# Patient Record
Sex: Male | Born: 1970 | Race: White | Hispanic: No | Marital: Single | Smoking: Never smoker
Health system: Southern US, Community
[De-identification: ages and names within clinical notes are randomized; demographics above are authoritative.]

## PROBLEM LIST (undated history)

## (undated) DIAGNOSIS — G4730 Sleep apnea, unspecified: Secondary | ICD-10-CM

## (undated) DIAGNOSIS — S83519A Sprain of anterior cruciate ligament of unspecified knee, initial encounter: Secondary | ICD-10-CM

## (undated) DIAGNOSIS — T7840XA Allergy, unspecified, initial encounter: Secondary | ICD-10-CM

## (undated) DIAGNOSIS — J45909 Unspecified asthma, uncomplicated: Secondary | ICD-10-CM

## (undated) DIAGNOSIS — J301 Allergic rhinitis due to pollen: Secondary | ICD-10-CM

## (undated) DIAGNOSIS — S82209A Unspecified fracture of shaft of unspecified tibia, initial encounter for closed fracture: Secondary | ICD-10-CM

## (undated) DIAGNOSIS — G2581 Restless legs syndrome: Secondary | ICD-10-CM

## (undated) HISTORY — DX: Allergic rhinitis due to pollen: J30.1

## (undated) HISTORY — PX: KNEE ARTHROSCOPY W/ ACL RECONSTRUCTION: SHX1858

## (undated) HISTORY — DX: Sleep apnea, unspecified: G47.30

## (undated) HISTORY — DX: Restless legs syndrome: G25.81

## (undated) HISTORY — DX: Unspecified fracture of shaft of unspecified tibia, initial encounter for closed fracture: S82.209A

## (undated) HISTORY — DX: Sprain of anterior cruciate ligament of unspecified knee, initial encounter: S83.519A

## (undated) HISTORY — PX: TIBIA FRACTURE SURGERY: SHX806

## (undated) HISTORY — DX: Unspecified asthma, uncomplicated: J45.909

## (undated) HISTORY — DX: Allergy, unspecified, initial encounter: T78.40XA

## (undated) HISTORY — PX: TONSILLECTOMY: SUR1361

---

## 1997-08-29 ENCOUNTER — Emergency Department (HOSPITAL_COMMUNITY): Admission: EM | Admit: 1997-08-29 | Discharge: 1997-08-29 | Payer: Self-pay | Admitting: Emergency Medicine

## 2007-05-20 ENCOUNTER — Ambulatory Visit: Payer: Self-pay | Admitting: Family Medicine

## 2007-08-08 ENCOUNTER — Ambulatory Visit: Payer: Self-pay | Admitting: Orthopaedic Surgery

## 2007-10-01 ENCOUNTER — Ambulatory Visit: Payer: Self-pay | Admitting: Orthopaedic Surgery

## 2008-02-06 ENCOUNTER — Ambulatory Visit: Payer: Self-pay | Admitting: Family Medicine

## 2010-05-20 ENCOUNTER — Ambulatory Visit: Payer: Self-pay | Admitting: Family Medicine

## 2010-05-20 DIAGNOSIS — L255 Unspecified contact dermatitis due to plants, except food: Secondary | ICD-10-CM | POA: Insufficient documentation

## 2010-06-30 NOTE — Assessment & Plan Note (Signed)
Summary: POISON OAK/CLE   Vital Signs:  Patient profile:   40 year old male Height:      68 inches Weight:      177.75 pounds BMI:     27.12 Temp:     97.9 degrees F oral Pulse rate:   80 / minute Pulse rhythm:   regular BP sitting:   110 / 70  (left arm) Cuff size:   regular  Vitals Entered By: Benny Lennert CMA Duncan Dull) (May 20, 2010 9:02 AM)  History of Present Illness: Chief complaint poison Lajoyce Corners  very pleasant patient presents to I know well who has some poison oak or IVy dermatitis is present on his face, or and including the posterior aspect of his neck. He also has some numbness on bilateral hands and on his feet bilaterally.  This is quite itchy, he had an episode before he had some severe poison ivy around his face and has some scarring above one of his eyes.  This point it is not interfering in her in the eye or ocular area.  Allergies (verified): No Known Drug Allergies  Past History:  Past medical, surgical, family and social histories (including risk factors) reviewed, and no changes noted (except as noted below).  Past Medical History: Reviewed history from 02/06/2008 and no changes required. L sided, ACL rupture, 04/2007 Tibial plateau fracture, 04/2007 Seasonal Allergies  Past Surgical History: Reviewed history from 02/06/2008 and no changes required. ACL reconstruction, 09/2007, Dr. Mack Guise, [facility] Arthroscopy, 07/2007, L knee, Dr. Mack Guise Tonsillectomy / Adenoidectomy Arthroscopy, R knee, 1990, scar tissue removal  Family History: Reviewed history from 02/06/2008 and no changes required. Family History of Colon CA 1st degree relative <60  Mother - 7, Breast CA Father 16  1 brother  Children - acid reflux     Social History: Reviewed history from 02/06/2008 and no changes required. 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 CV: No chest pain or SOB GI: No noted N or V Otherwise, pertinent positives and negatives are noted in the HPI.   Physical Exam  General:  GEN: Well-developed,well-nourished,in no acute distress; alert,appropriate and cooperative throughout examination HEENT: Normocephalic and atraumatic without obvious abnormalities. No apparent alopecia or balding. Ears, externally no deformities PULM: Breathing comfortably in no respiratory distress EXT: No clubbing, cyanosis, or edema PSYCH: Normally interactive. Cooperative during the interview. Pleasant. Friendly and conversant. Not anxious or depressed appearing. Normal, full affect.  Skin:  vesicular lines is evidence of excoriation and right-sided face, posterior neck the neck, bilateral hands, lower extremities as well.   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 IM Decadron 8 mg.  His updated medication list for this problem includes:    Triamcinolone Acetonide 0.1 % Crea (Triamcinolone acetonide) .Marland Kitchen... Apply two times a day to affected area  Orders: Dexamethasone Sodium Phosphate 1mg  (J1100) Admin of Therapeutic Inj  intramuscular or subcutaneous ([address])  Complete Medication List: 1)  Triamcinolone Acetonide 0.1 % Crea (Triamcinolone acetonide) .... Apply two times a day to affected area  Patient Instructions: 1)  STEROID SHOT WILL GET IN SYSTEM WITHIN THE DAY 2)  USE CREAM, BUT NOT ON FACE Prescriptions: TRIAMCINOLONE ACETONIDE 0.1 % CREA (TRIAMCINOLONE ACETONIDE) Apply two times a  day to affected area  #1 pound x 0   Entered and Authorized by:   Hannah Beat MD   Signed by:   Hannah Beat MD on 05/20/2010   Method used:   Electronically to        [facility] [id number]retail[facility]       [address]       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 Patient Level IV [99214] 2)  Dexamethasone Sodium Phosphate 1mg  [J1100] 3)  Admin of Therapeutic Inj  intramuscular or subcutaneous [96372]    Prior Medications (reviewed today): None Current Allergies (reviewed today): No known allergies

## 2010-09-23 ENCOUNTER — Encounter: Payer: Self-pay | Admitting: Family Medicine

## 2010-09-26 ENCOUNTER — Encounter: Payer: Self-pay | Admitting: Family Medicine

## 2010-09-26 ENCOUNTER — Ambulatory Visit (INDEPENDENT_AMBULATORY_CARE_PROVIDER_SITE_OTHER): Admitting: Family Medicine

## 2010-09-26 VITALS — BP 120/84 | HR 71 | Temp 98.5°F | Ht 68.0 in | Wt 176.1 lb

## 2010-09-26 DIAGNOSIS — J301 Allergic rhinitis due to pollen: Secondary | ICD-10-CM

## 2010-09-26 HISTORY — DX: Allergic rhinitis due to pollen: J30.1

## 2010-09-26 MED ORDER — FLUTICASONE PROPIONATE 50 MCG/ACT NA SUSP: 2.0000 | Freq: Every day | NASAL | Status: DC

## 2010-09-26 NOTE — Patient Instructions (Signed)
ALLERGIES -Sneezing, runny and stuffy nose, itchy eyes, nose, throat, tears.  TREATMENT 1. Avoid offending allergen 2. Air filtration devices can reduce concentration of allergen 3. Intranasal steroid sprays are the most effective control (FLONASE) 4. Sedating (Benadryl, Zyrtec) and Non-sedating (Allegra, Claritin,Loratidine,Alavert) antihistamines are available at pharmacy 5. Presciption anti-histamines are available if the above fail FOR NOW, TAKE SOME SUDAFED (DECONGESTANT)

## 2010-09-26 NOTE — Progress Notes (Signed)
40 year old:  Has some bad seasonal allergies, went to the a flight   On the second flight, felt heis ears go deaf some, had a lot of pain in his sinuses.  Now both ears are hurting a lot.  Now cannot hear.   Sinex mist.  No oral antihistamines.  Subjective:     Troy Sims is a 40 y.o. male who presents for evaluation and treatment of allergic symptoms. Symptoms include: clear rhinorrhea, nasal congestion, postnasal drip, pressure sensation in ears and sinus pressure and are present in a seasonal pattern. Precipitants include: pollen. Treatment currently includes nasal saline, intranasal decongestants:  and are not effective.  ROS: GEN: above GI: Tolerating PO intake GU: maintaining adequate hydration and urination Pulm: No SOB Interactive and getting along well at home.  Otherwise, ROS is as per the HPI.   Review of Systems   Objective:   Gen: WDWN, NAD; A & O x3, cooperative. Pleasant.Globally Non-toxic HEENT: Normocephalic and atraumatic. Throat clear, w/o exudate, R TM clear, L TM - good landmarks, No fluid present. rhinnorhea.  MMM Frontal sinuses: NT Max sinuses: NT NECK: Anterior cervical  LAD is absent CV: RRR, No M/G/R, cap refill <2 sec PULM: Breathing comfortably in no respiratory distress. no wheezing, crackles, rhonchi EXT: No c/c/e PSYCH: Friendly, good eye contact MSK: Nml gait    Assessment:    Allergic rhinitis.    Plan:   Flonase Refer to the patient instructions sections for details of plan shared with patient.

## 2011-06-01 ENCOUNTER — Ambulatory Visit (INDEPENDENT_AMBULATORY_CARE_PROVIDER_SITE_OTHER): Admitting: Family Medicine

## 2011-06-01 ENCOUNTER — Encounter: Payer: Self-pay | Admitting: Family Medicine

## 2011-06-01 VITALS — BP 120/70 | HR 67 | Temp 98.6°F | Wt 176.0 lb

## 2011-06-01 DIAGNOSIS — L02429 Furuncle of limb, unspecified: Secondary | ICD-10-CM

## 2011-06-01 MED ORDER — DOXYCYCLINE HYCLATE 100 MG PO TABS: 100.0000 mg | ORAL_TABLET | Freq: Two times a day (BID) | ORAL | Status: AC

## 2011-06-01 NOTE — Progress Notes (Signed)
  Patient Name: Troy Sims Date of Birth: [birth year] Age: 41 y.o. Medical Record Number: [id number] Gender: male Date of Encounter: 06/01/2011  History of Present Illness:  Troy Sims is a 41 y.o. very pleasant male patient who presents with the following:  Patient well known to me who presents with left-sided axillary redness and pain with discrete areas that are painful to touch. They have not been draining any pus. He has had this problem once before. 2 the areas have improved, and the other largest area was larger around the size of a golf ball, but has improved now. No no pus. He has been using some warm compresses.  Past Medical History, Surgical History, Social History, Family History, Problem List, Medications, and Allergies have been reviewed and updated if relevant.  Review of Systems: ROS: GEN: Acute illness details above GI: Tolerating PO intake GU: maintaining adequate hydration and urination Pulm: No SOB Interactive and getting along well at home.  Otherwise, ROS is as per the HPI.   Physical Examination: Filed Vitals:   06/01/11 1240  BP: 120/70  Pulse: 67  Temp: 98.6 F (37 C)  TempSrc: Oral  Weight: 176 lb (79.833 kg)    There is no height on file to calculate BMI.   GEN: WDWN, NAD, Non-toxic, Alert & Oriented x 3 HEENT: Atraumatic, Normocephalic.  Ears and Nose: No external deformity. EXTR: No clubbing/cyanosis/edema NEURO: Normal gait.  PSYCH: Normally interactive. Conversant. Not depressed or anxious appearing.  Calm demeanor.   SKIN: Left axilla with 1 large boil and 2-3 other lesions that are healing  Assessment and Plan: 1. Boil, axilla     Doxy 100 mg po bid x 10 d If he has recurrent problems, we may need to do Hibiclens protocol and bactroban nasal protocol

## 2011-09-04 ENCOUNTER — Ambulatory Visit (INDEPENDENT_AMBULATORY_CARE_PROVIDER_SITE_OTHER): Admitting: Family Medicine

## 2011-09-04 ENCOUNTER — Encounter: Payer: Self-pay | Admitting: Family Medicine

## 2011-09-04 ENCOUNTER — Ambulatory Visit (INDEPENDENT_AMBULATORY_CARE_PROVIDER_SITE_OTHER)
Admission: RE | Admit: 2011-09-04 | Discharge: 2011-09-04 | Disposition: A | Discharge status: Home / Self Care | Source: Ambulatory Visit | Attending: Family Medicine | Admitting: Family Medicine

## 2011-09-04 VITALS — BP 130/82 | HR 72 | Temp 98.3°F | Wt 175.0 lb

## 2011-09-04 DIAGNOSIS — S6010XA Contusion of unspecified finger with damage to nail, initial encounter: Secondary | ICD-10-CM

## 2011-09-04 DIAGNOSIS — S6990XA Unspecified injury of unspecified wrist, hand and finger(s), initial encounter: Secondary | ICD-10-CM

## 2011-09-04 DIAGNOSIS — S6000XA Contusion of unspecified finger without damage to nail, initial encounter: Secondary | ICD-10-CM

## 2011-09-04 DIAGNOSIS — S6980XA Other specified injuries of unspecified wrist, hand and finger(s), initial encounter: Secondary | ICD-10-CM

## 2011-09-04 NOTE — Progress Notes (Signed)
  Patient Name: Troy Sims Date of Birth: 17-Aug-1970 Age: 41 y.o. Medical Record Number: [id number] Gender: male Date of Encounter: 09/04/2011  History of Present Illness:  Troy Sims is a 41 y.o. very pleasant male patient who presents with the following:  Pleasant patient who on Friday, 09/01/2011, had a very heavy object fall on his right hand and finger. Since then he has experienced some significant pain, and also had some very severe bruising and pain underneath his fingernail and at the base. He is able to move it, flexion and extension are intact.  Past Medical History, Surgical History, Social History, Family History, Problem List, Medications, and Allergies have been reviewed and updated if relevant.  Review of Systems:  GEN: No fevers, chills. Nontoxic. Primarily MSK c/o today. MSK: Detailed in the HPI GI: tolerating PO intake without difficulty Neuro: No numbness, parasthesias, or tingling associated. Otherwise the pertinent positives of the ROS are noted above.    Physical Examination: Filed Vitals:   09/04/11 1428  BP: 130/82  Pulse: 72  Temp: 98.3 F (36.8 C)  TempSrc: Oral  Weight: 175 lb (79.379 kg)    There is no height on file to calculate BMI.   GEN: WDWN, NAD, Non-toxic, Alert & Oriented x 3 HEENT: Atraumatic, Normocephalic.  Ears and Nose: No external deformity. EXTR: No clubbing/cyanosis/edema NEURO: Normal gait.  PSYCH: Normally interactive. Conversant. Not depressed or anxious appearing.  Calm demeanor.   Left middle finger distally it is quite tender to palpate throughout the distal phalanx as well as tender to palpation on the dorsal aspect with extensive subungual hematoma. All hands and fingers move appropriately with normal range of motion. Nontender throughout the rest of the bony anatomy and the left hand  Assessment and Plan:  1. Injury of middle finger  DG Finger Middle Left, DG Finger Middle Left  2. Hematoma, subungual, finger        Dg Finger Middle Left  09/04/2011  *RADIOLOGY REPORT*  Clinical Data: Injury, left middle finger.  LEFT MIDDLE FINGER 2+V  Comparison: None.  Findings: No acute bony abnormality.  Specifically, no fracture, subluxation, or dislocation.  Soft tissues are intact.  Joint spaces and bone mineralization are normal.  IMPRESSION: Normal study.  Original Report Authenticated By: Cyndie Chime, M.D.  -- on my examination at the distalmost in, on the lateral view and oblique, there appears to be a very small radiopaque changes would be consistent adjacent to the bone with a small avulsion. I discussed this with the patient, and is likely not clinically significant and would alter our plan of care anyway.  Hematoma evacuation, left third digit. Verbal he consented. Prepped with alcohol. A paper clip was heated with a lighter until it became red hot. This was placed in the area of subungal hematoma. 2 passes needed to be made, and subsequently less then 1 cc of blood was expressed. Immediate pain relief after the procedure.   Orders Today: Orders Placed This Encounter  Procedures  . DG Finger Middle Left    Standing Status: Future     Number of Occurrences: 1     Standing Expiration Date: 11/03/2012    Order Specific Question:  Reason for exam:    Answer:  injury    Order Specific Question:  Preferred imaging location?    Answer:  [place]    Medications Today: No orders of the defined types were placed in this encounter.

## 2011-11-10 ENCOUNTER — Ambulatory Visit (INDEPENDENT_AMBULATORY_CARE_PROVIDER_SITE_OTHER): Admitting: Family Medicine

## 2011-11-10 ENCOUNTER — Encounter: Payer: Self-pay | Admitting: Family Medicine

## 2011-11-10 VITALS — BP 102/64 | HR 75 | Temp 98.4°F | Wt 179.0 lb

## 2011-11-10 DIAGNOSIS — L0390 Cellulitis, unspecified: Secondary | ICD-10-CM

## 2011-11-10 DIAGNOSIS — L0291 Cutaneous abscess, unspecified: Secondary | ICD-10-CM

## 2011-11-10 MED ORDER — DOXYCYCLINE HYCLATE 100 MG PO TABS: 100.0000 mg | ORAL_TABLET | Freq: Two times a day (BID) | ORAL | Status: AC

## 2011-11-10 NOTE — Progress Notes (Signed)
L axillary lesions.  Has happened prev and resolved.  It returned and he was able to drain it with compression.  Then had another lesion erupt nearby.  Area is sore.  No FCNAVD.    Meds, vitals, and allergies reviewed.   ROS: See HPI.  Otherwise, noncontributory.  nad ncat Neck supple, no LA No supraclavicular LA L axilla with two small tender lesions that appear to have prev drained.  Don't appear to communicate with each other.  No fluctuant mass.

## 2011-11-10 NOTE — Patient Instructions (Signed)
I would use warm compresses a few times a day.  Start the antibiotics and use dial soap or equivalent.  This should get better.

## 2011-11-12 DIAGNOSIS — L0291 Cutaneous abscess, unspecified: Secondary | ICD-10-CM | POA: Insufficient documentation

## 2011-11-12 NOTE — Assessment & Plan Note (Signed)
Has prev drained, no need to I&D today.  Use warm compressed, start doxy, f/u prn.  He agrees.

## 2012-05-02 ENCOUNTER — Telehealth: Payer: Self-pay

## 2012-05-02 NOTE — Telephone Encounter (Signed)
Pt left v/m; pt in [employer] and needs weigh in and flu shot. Pt needs ASAP and will come in for appt with Dr Patsy Lager if needed. Pt can come in anytime.Please advise.

## 2012-05-02 NOTE — Telephone Encounter (Signed)
Can i just do nurse visit?

## 2012-05-02 NOTE — Telephone Encounter (Signed)
Nurse visit ok, i don't need to see him

## 2012-05-03 ENCOUNTER — Ambulatory Visit

## 2012-05-08 ENCOUNTER — Encounter: Payer: Self-pay | Admitting: *Deleted

## 2012-05-08 ENCOUNTER — Ambulatory Visit (INDEPENDENT_AMBULATORY_CARE_PROVIDER_SITE_OTHER): Admitting: *Deleted

## 2012-05-08 DIAGNOSIS — Z23 Encounter for immunization: Secondary | ICD-10-CM

## 2012-11-13 ENCOUNTER — Ambulatory Visit (INDEPENDENT_AMBULATORY_CARE_PROVIDER_SITE_OTHER): Admitting: Family Medicine

## 2012-11-13 ENCOUNTER — Encounter: Payer: Self-pay | Admitting: Family Medicine

## 2012-11-13 VITALS — BP 122/82 | HR 76 | Temp 98.2°F | Wt 183.0 lb

## 2012-11-13 DIAGNOSIS — L02429 Furuncle of limb, unspecified: Secondary | ICD-10-CM

## 2012-11-13 DIAGNOSIS — M25561 Pain in right knee: Secondary | ICD-10-CM

## 2012-11-13 DIAGNOSIS — M25569 Pain in unspecified knee: Secondary | ICD-10-CM

## 2012-11-13 NOTE — Patient Instructions (Addendum)
[facility]

## 2012-11-13 NOTE — Progress Notes (Signed)
Nature conservation officer at [facility] [address] [place] [address] Phone: [phone] Fax: [phone]  Date:  11/13/2012   Name:  Troy Sims   DOB:  17-Oct-1970   MRN:  [id number] Gender: male Age: 42 y.o.  Primary Physician:  Hannah Beat, MD  Evaluating MD: Hannah Beat, MD   Chief Complaint: swollen painful red spot on right knee   History of Present Illness:  Troy Sims is a 42 y.o. pleasant patient who presents with the following:  Pain in right knee,  H/o L ACL with Dr. Mack Guise.  He has started to have a boil on the lateral aspect of the R patella on Sunday, then it has gotten progressively worse. On Tuesday, he tried to pop it and got some pus out. This morning he is having quite a bit more pain, pain deep in the knee joint, expanding redness, some swelling and pain with motion.  No systemic fever.   H/o posterior leg / gluteal abscess 1 year ago. Multiple family members with a history of MRSA.  Patient Active Problem List   Diagnosis Date Noted  . Abscess 11/12/2011  . Allergic rhinitis due to pollen 09/26/2010  . POISON IVY DERMATITIS 05/20/2010    Past Medical History  Diagnosis Date  . Allergy   . Tibial fracture   . ACL (anterior cruciate ligament) rupture   . Allergic rhinitis due to pollen 09/26/2010    No past surgical history on file.  History   Social History  . Marital Status: Single    Spouse Name: N/A    Number of Children: N/A  . Years of Education: N/A   Occupational History  . truck driver    Social History Main Topics  . Smoking status: Never Smoker   . Smokeless tobacco: Never Used  . Alcohol Use: No  . Drug Use: No  . Sexually Active: Not on file   Other Topics Concern  . 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 Cancer Mother     breast     No Known Allergies  Medication list has been reviewed and updated.  No outpatient prescriptions prior to visit.   No facility-administered medications prior to visit.    Review of Systems:   GEN: No acute illnesses, no fevers, chills. GI: No n/v/d, eating normally Pulm: No SOB Interactive and getting along well at home.  Otherwise, ROS is as per the HPI.   Physical Examination: BP 122/82  Pulse 76  Temp(Src) 98.2 F (36.8 C) (Oral)  Wt 183 lb (83.008 kg)  BMI 27.83 kg/m2  Ideal Body Weight:     GEN: WDWN, NAD, Non-toxic, Alert & Oriented x 3 HEENT: Atraumatic, Normocephalic.  Ears and Nose: No external deformity. EXTR: No clubbing/cyanosis/edema NEURO: Normal gait.  PSYCH: Normally interactive. Conversant. Not depressed or anxious appearing.  Calm demeanor.   Knee:  R Gait: Normal heel toe pattern ROM: 0-130 Effusion: mild Echymosis or edema: REDNESS, TENDERNESS AND WARMTH FOR 4 CM AROUND CENTRAL BOIL ON LATERAL ASPECT OF PATELLA Patellar tendon NT Painful PLICA: neg Patellar grind: negative Medial and lateral patellar facet loading: negative medial and lateral joint lines:NT Mcmurray's neg Flexion-pinch neg Varus and valgus stress: stable  Lachman: neg Ant and Post drawer: neg Hip abduction, IR, ER: WNL Hip flexion str: 5/5 Hip abd: 5/5 Quad: 5/5 VMO atrophy:No Hamstring concentric and eccentric: 5/5   Assessment and Plan: Boil, knee  Knee pain, acute, right   With his history and worsening condition, certainly has a skin infection, and I have some concern that this could be an early septic joint. I called Dr. Yisroel Ramming who kindly agreed to see the patient this afternoon.  Signed, Elpidio Galea. Marcia Hartwell, MD 11/13/2012 4:28 PM

## 2013-01-06 ENCOUNTER — Telehealth: Payer: Self-pay

## 2013-01-06 DIAGNOSIS — F4322 Adjustment disorder with anxiety: Secondary | ICD-10-CM

## 2013-01-06 NOTE — Telephone Encounter (Signed)
Pt's wife request referral to [facility], counselors in [facility] fax # [phone] and phone # [phone]. Pt request marriage counseling and does require referral from PCP; pt was first seen with [facility] on 01/03/13 and scheduled next visit at 01/10/13. Pt's wife request cb.

## 2013-01-06 NOTE — Telephone Encounter (Signed)
Left message asking pt's wife to call back.

## 2013-01-07 NOTE — Telephone Encounter (Signed)
Spoke with patient's wife, pt needs referral before his appt with counselor on Friday.

## 2013-01-08 NOTE — Telephone Encounter (Signed)
Dr Ermalene Searing, I spoke to the counseling ctr secretary and please use code 309.24 Anxious Mood for his DX code for this referral and I will send it over to their office.

## 2013-03-27 ENCOUNTER — Encounter: Payer: Self-pay | Admitting: Family Medicine

## 2013-03-27 ENCOUNTER — Ambulatory Visit (INDEPENDENT_AMBULATORY_CARE_PROVIDER_SITE_OTHER): Admitting: Family Medicine

## 2013-03-27 VITALS — BP 104/78 | HR 70 | Temp 98.1°F | Ht 68.0 in | Wt 184.2 lb

## 2013-03-27 DIAGNOSIS — L0390 Cellulitis, unspecified: Secondary | ICD-10-CM

## 2013-03-27 DIAGNOSIS — L0291 Cutaneous abscess, unspecified: Secondary | ICD-10-CM

## 2013-03-27 DIAGNOSIS — L02416 Cutaneous abscess of left lower limb: Secondary | ICD-10-CM

## 2013-03-27 DIAGNOSIS — L02419 Cutaneous abscess of limb, unspecified: Secondary | ICD-10-CM

## 2013-03-27 MED ORDER — CEFTRIAXONE SODIUM 1 G IJ SOLR
1.0000 g | Freq: Once | INTRAMUSCULAR | Status: AC
  Administered 2013-03-27: 1 g via INTRAMUSCULAR

## 2013-03-27 MED ORDER — CEPHALEXIN 500 MG PO CAPS: 1000.0000 mg | ORAL_CAPSULE | Freq: Two times a day (BID) | ORAL | Status: DC

## 2013-03-27 MED ORDER — SULFAMETHOXAZOLE-TMP DS 800-160 MG PO TABS: 2.0000 | ORAL_TABLET | Freq: Two times a day (BID) | ORAL | Status: DC

## 2013-03-28 NOTE — Progress Notes (Signed)
   Date:  03/27/2013   Name:  Troy Sims   DOB:  Apr 10, 1971   MRN:  [id number] Gender: male Age: 42 y.o.  Primary Physician:  Hannah Beat, MD   Chief Complaint: Cellulitis   History of Present Illness:  Troy Sims is a 42 y.o. very pleasant male patient who presents with the following:  Pleasant gentleman however no well who is presenting today with an approximate two-day history of worsening redness and warmth on the lateral aspect of his LEFT hip. He is not had any sort of trauma or injury at all. He does have a significant history for multiple boils and abscesses in the past. He has had one in the axilla and most recently had one on the lateral aspect of his knee. All of these have done relatively well and been treated conservatively with antibiotics with resolution. He does have a daughter with a positive history of MRSA.  Past Medical History, Surgical History, Social History, Family History, Problem List, Medications, and Allergies have been reviewed and updated if relevant.  Current Outpatient Prescriptions on File Prior to Visit  Medication Sig Dispense Refill  . Ibuprofen (ADVIL) 200 MG CAPS Take by mouth as needed.       No current facility-administered medications on file prior to visit.    Review of Systems: ROS: GEN: Acute illness details above GI: Tolerating PO intake GU: maintaining adequate hydration and urination Pulm: No SOB Interactive and getting along well at home.  Otherwise, ROS is as per the HPI.   Physical Examination: BP 104/78  Pulse 70  Temp(Src) 98.1 F (36.7 C) (Oral)  Ht 5\' 8"  (1.727 m)  Wt 184 lb 4 oz (83.575 kg)  BMI 28.02 kg/m2   GEN: WDWN, NAD, Non-toxic, Alert & Oriented x 3 HEENT: Atraumatic, Normocephalic.  Ears and Nose: No external deformity. EXTR: No clubbing/cyanosis/edema NEURO: Normal gait.  PSYCH: Normally interactive. Conversant. Not depressed or anxious appearing.  Calm demeanor.   Skin: on the lateral  aspect of the left hip, the patient has redness, warmth with some induration but no fluctuance.  Assessment and Plan:  Abscess of hip, left  Cellulitis - Plan: cefTRIAXone (ROCEPHIN) injection 1 g  ABX, IM now Then keflex and septra Warm compresses Strict precautions Borders drawn with sharpie  There are no Patient Instructions on file for this visit.  Orders Today:  No orders of the defined types were placed in this encounter.    Updated Medication List: (Includes new medications, updates to list, dose adjustments) Meds ordered this encounter  Medications  . cephALEXin (KEFLEX) 500 MG capsule    Sig: Take 2 capsules (1,000 mg total) by mouth 2 (two) times daily.    Dispense:  40 capsule    Refill:  0  . sulfamethoxazole-trimethoprim (BACTRIM DS) 800-160 MG per tablet    Sig: Take 2 tablets by mouth 2 (two) times daily.    Dispense:  40 tablet    Refill:  0  . cefTRIAXone (ROCEPHIN) injection 1 g    Sig:     Medications Discontinued: There are no discontinued medications.    Signed,  Elpidio Galea. Arshdeep Bolger, MD, CAQ Sports Medicine  [employer] at [facility] [address] [place] [address] Phone: [phone] Fax: [phone]

## 2013-04-03 ENCOUNTER — Other Ambulatory Visit: Payer: Self-pay

## 2013-08-04 ENCOUNTER — Ambulatory Visit (INDEPENDENT_AMBULATORY_CARE_PROVIDER_SITE_OTHER): Admitting: Family Medicine

## 2013-08-04 ENCOUNTER — Encounter: Payer: Self-pay | Admitting: Family Medicine

## 2013-08-04 VITALS — BP 106/68 | HR 68 | Temp 98.1°F | Ht 68.0 in | Wt 191.8 lb

## 2013-08-04 DIAGNOSIS — M6790 Unspecified disorder of synovium and tendon, unspecified site: Secondary | ICD-10-CM

## 2013-08-04 DIAGNOSIS — M259 Joint disorder, unspecified: Secondary | ICD-10-CM

## 2013-08-04 DIAGNOSIS — M719 Bursopathy, unspecified: Secondary | ICD-10-CM

## 2013-08-04 DIAGNOSIS — M19019 Primary osteoarthritis, unspecified shoulder: Secondary | ICD-10-CM

## 2013-08-04 DIAGNOSIS — M62838 Other muscle spasm: Secondary | ICD-10-CM

## 2013-08-04 DIAGNOSIS — M67919 Unspecified disorder of synovium and tendon, unspecified shoulder: Secondary | ICD-10-CM

## 2013-08-04 NOTE — Progress Notes (Signed)
Pre visit review using our clinic review tool, if applicable. No additional management support is needed unless otherwise documented below in the visit note. 

## 2013-08-04 NOTE — Patient Instructions (Signed)
Advice about weightlifting with SHOULDER PROBLEMS:  1. Avoid overhead presses, military presses, clean and jerk, snatch, heavy incline bench presses, heavy bench presses, any kind of chest fly. Heavy lateral raises will hurt.  ALL OF THESE MAKE THE ROTATOR CUFF IMPINGE ON THE BONY ANATOMY ABOVE THEM AND OFTEN CAUSE RECURRENT BURSITIS AND ROTATOR CUFF TENDINITIS.  2. If you have to do bench presses then I would do the decline press and not lock out at the end of the lift. DUMBBELLS WILL TAKE STRESS OFF THE SHOULDER, SO I RECOMMEND SWITCHING FROM BAR PRESSES TO DUMBBELLS.  Continue with rotator cuff strengthening and scapular stabilizaton. Recommended that you could do all aerobic actvity.  3. All pulls, lat pull downs, pull-ups, mid-back low back, arms should be no problem. 4. Core is fine  

## 2013-08-04 NOTE — Progress Notes (Signed)
Date:  08/04/2013   Name:  Troy Sims   DOB:  12/21/1970   MRN:  [id number]  PCP:  Hannah Beat, MD   This 43 y.o. male patient noted above presents with shoulder pain that has been ongoing for 1 mo.  there is no history of trauma or accident recently The patient denies neck pain or radicular symptoms. Denies dislocation, subluxation, separation of the shoulder. The patient does complain occ of pain in the overhead plane with no painful arc of motion. Mostly around upper trap spasm, some with IROM  Medications Tried: Tylenol, NSAIDS Ice or Heat: minimally helpful Tried PT: No  Prior shoulder Injury: No Prior surgery: No Prior fracture: No  Separated now from wife.   R shoulder on the back of his back shoulder blacde. But he can do anything. Occ will lose strength in his right arm.   Throwing softball yesterday, and it did not hurt. A little hurt in doing   1 mo.   The PMH, PSH, Social History, Family History, Medications, and allergies have been reviewed in [facility], and have been updated if relevant.  REVIEW OF SYSTEMS  GEN: No fevers, chills. Nontoxic. Primarily MSK c/o today. MSK: Detailed in the HPI GI: tolerating PO intake without difficulty Neuro: No numbness, parasthesias, or tingling associated. Otherwise the pertinent positives of the ROS are noted above.   PHYSICAL EXAM  Blood pressure 106/68, pulse 68, temperature 98.1 F (36.7 C), temperature source Oral, height 5\' 8"  (1.727 m), weight 191 lb 12 oz (86.977 kg).  GEN: Well-developed,well-nourished,in no acute distress; alert,appropriate and cooperative throughout examination HEENT: Normocephalic and atraumatic without obvious abnormalities. Ears, externally no deformities PULM: Breathing comfortably in no respiratory distress EXT: No clubbing, cyanosis, or edema PSYCH: Normally interactive. Cooperative during the interview. Pleasant. Friendly and conversant. Not anxious or depressed appearing. Normal,  full affect.  Shoulder: R Inspection: No muscle wasting or winging Ecchymosis/edema: neg  AC joint, scapula, clavicle: NT Cervical spine: NT, full ROM Spurling's: neg Abduction: full, 5/5 Flexion: full, 5/5 IR, full, lift-off: 5/5, some pain ER at neutral: full, 5/5 AC crossover: pos Neer: neg Hawkins: mild pos obriens neg Sulcus neg Drop Test: neg Empty Can: pos Supraspinatus insertion: mild-mod T Bicipital groove: NT Speed's: neg Yergason's: neg Sulcus sign: neg Scapular dyskinesis: none - some pain R upper trap C5-T1 intact  Neuro: Sensation intact Grip 5/5   Assessment and Plan:  Tendinopathy of rotator cuff  AC joint arthropathy  Trapezius muscle spasm   I think he will do fine with altering his lifts and working on RTC and scapular stabilization. If probs, I can set up with pt.  Rotator cuff strengthening and scapular stabilization exercises were reviewed with the patient.  [facility] RTC and scapular stabilization program or MOON shoulder protocol given to the patient. Retraining shoulder mechanics and function was emphasized to the patient with rehab done at least 5-6 days a week.  The patient could benefit from formal PT to assist with scapular stabilization and RTC strengthening.  Signed,  Elpidio Galea. Alleigh Mollica, MD, CAQ Sports Medicine  [facility] at [facility] [address] [place] [address] Phone: [phone] Fax: [phone]  Patient Instructions  Advice about weightlifting with SHOULDER PROBLEMS:  1. Avoid overhead presses, military presses, clean and jerk, snatch, heavy incline bench presses, heavy bench presses, any kind of chest fly. Heavy lateral raises will hurt.  ALL OF THESE MAKE THE ROTATOR CUFF IMPINGE ON THE BONY ANATOMY ABOVE THEM AND  OFTEN CAUSE RECURRENT BURSITIS AND ROTATOR CUFF TENDINITIS.  2. If you have to do bench presses then I would do the decline press and not lock out at the end of the lift. DUMBBELLS WILL  TAKE STRESS OFF THE SHOULDER, SO I RECOMMEND SWITCHING FROM BAR PRESSES TO DUMBBELLS.  Continue with rotator cuff strengthening and scapular stabilizaton. Recommended that you could do all aerobic actvity.  3. All pulls, lat pull downs, pull-ups, mid-back low back, arms should be no problem. 4. Core is fine      Patient's Medications  New Prescriptions   No medications on file  Previous Medications   No medications on file  Modified Medications   No medications on file  Discontinued Medications   CEPHALEXIN (KEFLEX) 500 MG CAPSULE    Take 2 capsules (1,000 mg total) by mouth 2 (two) times daily.   IBUPROFEN (ADVIL) 200 MG CAPS    Take by mouth as needed.   SULFAMETHOXAZOLE-TRIMETHOPRIM (BACTRIM DS) 800-160 MG PER TABLET    Take 2 tablets by mouth 2 (two) times daily.

## 2013-10-02 ENCOUNTER — Telehealth: Payer: Self-pay | Admitting: Family Medicine

## 2013-10-02 NOTE — Telephone Encounter (Signed)
Pt called requesting referral to PT. Patient has been doing shoulder exercises for almost a month to try and help the pain but not working well.   Call back [phone].

## 2013-10-02 NOTE — Telephone Encounter (Signed)
Can await PCP return

## 2013-10-08 ENCOUNTER — Other Ambulatory Visit: Payer: Self-pay | Admitting: Family Medicine

## 2013-10-08 DIAGNOSIS — M67919 Unspecified disorder of synovium and tendon, unspecified shoulder: Secondary | ICD-10-CM

## 2013-10-08 DIAGNOSIS — M19019 Primary osteoarthritis, unspecified shoulder: Secondary | ICD-10-CM

## 2013-10-08 NOTE — Telephone Encounter (Signed)
Left message for Mr. Troy Sims to return my call.

## 2013-10-08 NOTE — Telephone Encounter (Signed)
Mr. Troy Sims notified referral for PT has been ordered.  Await call from Troy Sims or Troy Sims with appointment.  Follow up with Dr. Patsy Sims 6 to 8 weeks after starting PT.

## 2013-10-08 NOTE — Telephone Encounter (Signed)
Please call  Sorry, i have been out of town.  I would do that as the next step, then f/u with me in about 6-8 weeks after starting PT. (15 min)

## 2013-11-12 ENCOUNTER — Encounter: Payer: Self-pay | Admitting: Family Medicine

## 2013-11-12 ENCOUNTER — Ambulatory Visit (INDEPENDENT_AMBULATORY_CARE_PROVIDER_SITE_OTHER): Admitting: Family Medicine

## 2013-11-12 VITALS — BP 122/70 | HR 74 | Temp 98.5°F | Wt 185.0 lb

## 2013-11-12 DIAGNOSIS — M259 Joint disorder, unspecified: Secondary | ICD-10-CM

## 2013-11-12 DIAGNOSIS — M19019 Primary osteoarthritis, unspecified shoulder: Secondary | ICD-10-CM

## 2013-11-12 DIAGNOSIS — M6790 Unspecified disorder of synovium and tendon, unspecified site: Secondary | ICD-10-CM

## 2013-11-12 DIAGNOSIS — M67919 Unspecified disorder of synovium and tendon, unspecified shoulder: Secondary | ICD-10-CM

## 2013-11-12 DIAGNOSIS — M719 Bursopathy, unspecified: Secondary | ICD-10-CM

## 2013-11-12 NOTE — Progress Notes (Signed)
Pre visit review using our clinic review tool, if applicable. No additional management support is needed unless otherwise documented below in the visit note. 

## 2013-11-12 NOTE — Patient Instructions (Signed)
Advice about weightlifting with SHOULDER PROBLEMS:  1. Avoid overhead presses, military presses, clean and jerk, snatch, heavy incline bench presses, heavy bench presses, any kind of chest fly. Heavy lateral raises will hurt.  ALL OF THESE MAKE THE ROTATOR CUFF IMPINGE ON THE BONY ANATOMY ABOVE THEM AND OFTEN CAUSE RECURRENT BURSITIS AND ROTATOR CUFF TENDINITIS.  2. If you have to do bench presses then I would do the decline press and not lock out at the end of the lift. DUMBBELLS WILL TAKE STRESS OFF THE SHOULDER, SO I RECOMMEND SWITCHING FROM BAR PRESSES TO DUMBBELLS.  Continue with rotator cuff strengthening and scapular stabilizaton. Recommended that you could do all aerobic actvity.  3. All pulls, lat pull downs, pull-ups, mid-back low back, arms should be no problem. 4. Core is fine  

## 2013-11-12 NOTE — Progress Notes (Signed)
[address] [place] [address] Phone: [phone] Fax: [phone]  Patient ID: Troy RicherGregory O Sims MRN: [id number], DOB: 1970/08/25, 43 y.o. Date of Encounter: 11/12/2013  Primary Physician:  Hannah BeatSpencer Copland, MD   Chief Complaint: Follow-up   Subjective:   History of Present Illness:  Troy Sims is a 43 y.o. very pleasant male patient who presents with the following:  Troy Sims is here in followup after 2 months of physical therapy, rotator cuff strengthening, scapular stabilization, and 3 months if shoulder pain. He is actually remarkably better. He still has occasional pain and has some occasional pain in his a.c. joint, but he otherwise is asymptomatic  Past Medical History, Surgical History, Social History, Family History, Problem List, Medications, and Allergies have been reviewed and updated if relevant.  Review of Systems:  GEN: No fevers, chills. Nontoxic. Primarily MSK c/o today. MSK: Detailed in the HPI GI: tolerating PO intake without difficulty Neuro: No numbness, parasthesias, or tingling associated. Otherwise the pertinent positives of the ROS are noted above.   Objective:   Physical Examination: BP 122/70  Pulse 74  Temp(Src) 98.5 F (36.9 C) (Tympanic)  Wt 185 lb (83.915 kg)  SpO2 98%   GEN: WDWN, NAD, Non-toxic, Alert & Oriented x 3 HEENT: Atraumatic, Normocephalic.  Ears and Nose: No external deformity. EXTR: No clubbing/cyanosis/edema NEURO: Normal gait.  PSYCH: Normally interactive. Conversant. Not depressed or anxious appearing.  Calm demeanor.   Shoulder: R Inspection: No muscle wasting or winging Ecchymosis/edema: neg  AC joint, scapula, clavicle: NT Cervical spine: NT, full ROM Spurling's: neg Abduction: full, 5/5 Flexion: full, 5/5 IR, full, lift-off: 5/5 ER at neutral: full, 5/5 AC crossover and compression: neg Neer: neg Hawkins: neg Drop Test: neg Empty Can: neg Supraspinatus insertion: NT Bicipital groove: NT Speed's:  neg Yergason's: neg Sulcus sign: neg Scapular dyskinesis: none C5-T1 intact Sensation intact Grip 5/5   Radiology: No results found.  Assessment & Plan:   Tendinopathy of rotator cuff  AC joint arthropathy  Twice weekly, continue with rotator cuff strengthening and scapular stabilization. Followup when necessary to  Patient Instructions  Advice about weightlifting with SHOULDER PROBLEMS:  1. Avoid overhead presses, military presses, clean and jerk, snatch, heavy incline bench presses, heavy bench presses, any kind of chest fly. Heavy lateral raises will hurt.  ALL OF THESE MAKE THE ROTATOR CUFF IMPINGE ON THE BONY ANATOMY ABOVE THEM AND OFTEN CAUSE RECURRENT BURSITIS AND ROTATOR CUFF TENDINITIS.  2. If you have to do bench presses then I would do the decline press and not lock out at the end of the lift. DUMBBELLS WILL TAKE STRESS OFF THE SHOULDER, SO I RECOMMEND SWITCHING FROM BAR PRESSES TO DUMBBELLS.  Continue with rotator cuff strengthening and scapular stabilizaton. Recommended that you could do all aerobic actvity.  3. All pulls, lat pull downs, pull-ups, mid-back low back, arms should be no problem. 4. Core is fine      New Prescriptions   No medications on file   Modified Medications   No medications on file   No orders of the defined types were placed in this encounter.   Follow-up: No Follow-up on file. Unless noted above, the patient is to follow-up if symptoms worsen. Red flags were reviewed with the patient.  Signed,  Elpidio GaleaSpencer T. Copland, MD, CAQ Sports Medicine   Discontinued Medications   No medications on file   Current Medications at Discharge:   Medication List    Notice As of 11/12/2013  2:19  PM   You have not been prescribed any medications.

## 2014-02-18 ENCOUNTER — Ambulatory Visit (INDEPENDENT_AMBULATORY_CARE_PROVIDER_SITE_OTHER): Admitting: Internal Medicine

## 2014-02-18 ENCOUNTER — Encounter: Payer: Self-pay | Admitting: Internal Medicine

## 2014-02-18 VITALS — BP 110/78 | HR 63 | Temp 98.3°F | Wt 186.8 lb

## 2014-02-18 DIAGNOSIS — L03221 Cellulitis of neck: Secondary | ICD-10-CM

## 2014-02-18 DIAGNOSIS — L0211 Cutaneous abscess of neck: Secondary | ICD-10-CM

## 2014-02-18 MED ORDER — SULFAMETHOXAZOLE-TMP DS 800-160 MG PO TABS: 1.0000 | ORAL_TABLET | Freq: Two times a day (BID) | ORAL | Status: DC

## 2014-02-18 NOTE — Progress Notes (Signed)
Pre visit review using our clinic review tool, if applicable. No additional management support is needed unless otherwise documented below in the visit note. 

## 2014-02-18 NOTE — Progress Notes (Signed)
   Subjective:    Patient ID: Troy Sims, male    DOB: 07-01-1970, 43 y.o.   MRN: [id number]  HPI  Pt presents to the clinic today with c/o a lump on the back of his neck. He noticed this 3 days ago. It started out like a pimple. It has grown in size and become more painful. He has tried to squeeze it but nothing has come out. He denies fever, chills or body aches.  Review of Systems      Past Medical History  Diagnosis Date  . Allergy   . Tibial fracture   . ACL (anterior cruciate ligament) rupture   . Allergic rhinitis due to pollen 09/26/2010    Current Outpatient Prescriptions  Medication Sig Dispense Refill  . sulfamethoxazole-trimethoprim (BACTRIM DS) 800-160 MG per tablet Take 1 tablet by mouth 2 (two) times daily.  14 tablet  0   No current facility-administered medications for this visit.    No Known Allergies  Family History  Problem Relation Age of Onset  . Cancer Mother     breast    History   Social History  . Marital Status: Single    Spouse Name: N/A    Number of Children: N/A  . Years of Education: N/A   Occupational History  . truck driver    Social History Main Topics  . Smoking status: Never Smoker   . Smokeless tobacco: Never Used  . Alcohol Use: No  . Drug Use: No  . Sexual Activity: Not on file   Other Topics Concern  . 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  Skin: Pt reports lump on neck. Denies rashes, lesions or ulcercations.  .   No other specific complaints in a complete review of systems (except as listed in HPI above).  Objective:   Physical Exam  BP 110/78  Pulse 63  Temp(Src) 98.3 F (36.8 C) (Oral)  Wt 186 lb 12 oz (84.709 kg)  SpO2 98% Wt Readings from Last 3 Encounters:  02/18/14 186 lb 12 oz (84.709 kg)    11/12/13 185 lb (83.915 kg)  08/04/13 191 lb 12 oz (86.977 kg)    General: Appears his stated age, well developed, well nourished in NAD. Skin: Small abscess (1 cm by 1 cm) noted on nape of neck. No surrounding cellulitis. It has come to a head. Firm. Cardiovascular: Normal rate and rhythm. S1,S2 noted.  No murmur, rubs or gallops noted.  Pulmonary/Chest: Normal effort and positive vesicular breath sounds. No respiratory distress. No wheezes, rales or ronchi noted.         Assessment & Plan:   Abscess:  Will treat with Septra x 7 days Warm compresses TID If is drains, cover it with a bandaid Try to avoid touching  RTC as needed or if symptoms persist or worsen

## 2014-02-18 NOTE — Patient Instructions (Addendum)

## 2014-12-21 ENCOUNTER — Other Ambulatory Visit: Payer: Self-pay | Admitting: Family Medicine

## 2014-12-21 DIAGNOSIS — Z13220 Encounter for screening for lipoid disorders: Secondary | ICD-10-CM

## 2014-12-21 DIAGNOSIS — Z79899 Other long term (current) drug therapy: Secondary | ICD-10-CM

## 2014-12-24 ENCOUNTER — Other Ambulatory Visit (INDEPENDENT_AMBULATORY_CARE_PROVIDER_SITE_OTHER)

## 2014-12-24 DIAGNOSIS — Z79899 Other long term (current) drug therapy: Secondary | ICD-10-CM

## 2014-12-24 DIAGNOSIS — Z13220 Encounter for screening for lipoid disorders: Secondary | ICD-10-CM | POA: Diagnosis not present

## 2014-12-24 LAB — BASIC METABOLIC PANEL
BUN: 15 mg/dL (ref 6–23)
CO2: 30 mEq/L (ref 19–32)
Calcium: 9.1 mg/dL (ref 8.4–10.5)
Chloride: 106 mEq/L (ref 96–112)
Creatinine, Ser: 0.98 mg/dL (ref 0.40–1.50)
GFR: 88.76 mL/min (ref >60.00)
Glucose, Bld: 100 mg/dL — ABNORMAL HIGH (ref 70–99)
Potassium: 4.4 mEq/L (ref 3.5–5.1)
SODIUM: 141 meq/L (ref 135–145)

## 2014-12-24 LAB — CBC WITH DIFFERENTIAL/PLATELET
BASOS PCT: 0.4 % (ref 0.0–3.0)
Basophils Absolute: 0 10*3/uL (ref 0.0–0.1)
EOS ABS: 0.4 10*3/uL (ref 0.0–0.7)
Eosinophils Relative: 6.6 % — ABNORMAL HIGH (ref 0.0–5.0)
HCT: 45.8 % (ref 39.0–52.0)
HEMOGLOBIN: 15.8 g/dL (ref 13.0–17.0)
LYMPHS ABS: 1.8 10*3/uL (ref 0.7–4.0)
Lymphocytes Relative: 27.2 % (ref 12.0–46.0)
MCHC: 34.5 g/dL (ref 30.0–36.0)
MCV: 91.1 fl (ref 78.0–100.0)
MONO ABS: 0.5 10*3/uL (ref 0.1–1.0)
Monocytes Relative: 7 % (ref 3.0–12.0)
Neutro Abs: 3.9 10*3/uL (ref 1.4–7.7)
Neutrophils Relative %: 58.8 % (ref 43.0–77.0)
Platelets: 295 10*3/uL (ref 150.0–400.0)
RBC: 5.03 Mil/uL (ref 4.22–5.81)
RDW: 12.4 % (ref 11.5–15.5)
WBC: 6.6 10*3/uL (ref 4.0–10.5)

## 2014-12-24 LAB — HEPATIC FUNCTION PANEL
ALBUMIN: 4.3 g/dL (ref 3.5–5.2)
ALT: 24 U/L (ref 0–53)
AST: 25 U/L (ref 0–37)
Alkaline Phosphatase: 76 U/L (ref 39–117)
BILIRUBIN DIRECT: 0.1 mg/dL (ref 0.0–0.3)
BILIRUBIN TOTAL: 0.3 mg/dL (ref 0.2–1.2)
TOTAL PROTEIN: 7.1 g/dL (ref 6.0–8.3)

## 2014-12-24 LAB — LIPID PANEL
CHOLESTEROL: 188 mg/dL (ref 0–200)
HDL: 39.6 mg/dL (ref >39.00)
LDL Cholesterol: 128 mg/dL — ABNORMAL HIGH (ref 0–99)
NonHDL: 148.14
Total CHOL/HDL Ratio: 5
Triglycerides: 101 mg/dL (ref 0.0–149.0)
VLDL: 20.2 mg/dL (ref 0.0–40.0)

## 2014-12-31 ENCOUNTER — Ambulatory Visit (INDEPENDENT_AMBULATORY_CARE_PROVIDER_SITE_OTHER): Admitting: Family Medicine

## 2014-12-31 ENCOUNTER — Encounter: Payer: Self-pay | Admitting: Family Medicine

## 2014-12-31 VITALS — BP 104/78 | HR 58 | Temp 98.4°F | Ht 68.5 in | Wt 183.5 lb

## 2014-12-31 DIAGNOSIS — Z0000 Encounter for general adult medical examination without abnormal findings: Secondary | ICD-10-CM | POA: Diagnosis not present

## 2014-12-31 NOTE — Progress Notes (Signed)
Pre visit review using our clinic review tool, if applicable. No additional management support is needed unless otherwise documented below in the visit note. 

## 2014-12-31 NOTE — Progress Notes (Signed)
Dr. Frederico Hamman T. Laquasia Pincus, MD, [facility] [place], [address] Phone: [phone] Fax: [phone]  12/31/2014  Patient: Troy Sims, MRN: [id number], DOB: 02-11-1971, 44 y.o.  Primary Physician:  Owens Loffler, MD  Chief Complaint: Annual Exam  Subjective:   Troy Sims is a 44 y.o. pleasant patient who presents with the following:  Preventative Health Maintenance Visit:  Health Maintenance Summary Reviewed and updated, unless pt declines services.  Tobacco History Reviewed. Alcohol: No concerns, no excessive use Exercise Habits: working out a lot, [employer], cross fit STD concerns: no risk or activity to increase risk Drug Use: None Encouraged self-testicular check  Health Maintenance  Topic Date Due  . HIV Screening  01/23/1987  . TETANUS/TDAP  05/29/2014  . INFLUENZA VACCINE  12/28/2014   Immunization History  Administered Date(s) Administered  . Influenza Split 05/08/2012  . Td 05/29/2004   Patient Active Problem List   Diagnosis Date Noted  . Allergic rhinitis due to pollen 09/26/2010   Past Medical History  Diagnosis Date  . Tibial fracture   . ACL (anterior cruciate ligament) rupture   . Allergic rhinitis due to pollen 09/26/2010   Past Surgical History  Procedure Laterality Date  . Knee arthroscopy w/ acl reconstruction     History   Social History  . Marital Status: Single    Spouse Name: N/A  . Number of Children: N/A  . Years of Education: N/A   Occupational History  . truck driver    Social History Main Topics  . Smoking status: Never Smoker   . Smokeless tobacco: Never Used  . Alcohol Use: No  . Drug Use: No  . Sexual Activity: Not on file   Other Topics Concern  . 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 Cancer Mother     breast   No Known Allergies  Medication list has been reviewed and updated.   General: Denies fever, chills, sweats. No significant weight loss. Eyes: Denies blurring,significant itching ENT: Denies earache, sore throat, and hoarseness. Cardiovascular: Denies chest pains, palpitations, dyspnea on exertion Respiratory: Denies cough, dyspnea at rest,wheeezing Breast: no concerns about lumps GI: Denies nausea, vomiting, diarrhea, constipation, change in bowel habits, abdominal pain, melena, hematochezia GU: Denies penile discharge, ED, urinary flow / outflow problems. No STD concerns. Musculoskeletal: Denies back pain, joint pain Derm: Denies rash, itching Neuro: Denies  paresthesias, frequent falls, frequent headaches Psych: Denies depression, anxiety Endocrine: Denies cold intolerance, heat intolerance, polydipsia Heme: Denies enlarged lymph nodes Allergy: No hayfever  Objective:   BP 104/78 mmHg  Pulse 58  Temp(Src) 98.4 F (36.9 C) (Oral)  Ht 5' 8.5" (1.74 m)  Wt 183 lb 8 oz (83.235 kg)  BMI 27.49 kg/m2 Ideal Body Weight: Weight in (lb) to have BMI = 25: 166.5  No exam data present  GEN: well developed, well nourished, no acute distress Eyes: conjunctiva and lids normal, PERRLA, EOMI ENT: TM clear, nares clear, oral exam WNL Neck: supple, no lymphadenopathy, no thyromegaly, no JVD Pulm: clear to auscultation and percussion, respiratory effort normal CV: regular rate and rhythm, S1-S2, no murmur, rub or gallop, no bruits, peripheral pulses normal and symmetric, no cyanosis, clubbing,  edema or varicosities GI: soft, non-tender; no hepatosplenomegaly, masses; active bowel sounds all quadrants GU: no hernia, testicular mass, penile discharge Lymph: no cervical, axillary or inguinal adenopathy MSK: gait normal, muscle tone and strength WNL, no joint swelling, effusions, discoloration, crepitus  SKIN: clear, good turgor, color WNL, no rashes,  lesions, or ulcerations Neuro: normal mental status, normal strength, sensation, and motion Psych: alert; oriented to person, place and time, normally interactive and not anxious or depressed in appearance. All labs reviewed with patient.  Lipids:    Component Value Date/Time   CHOL 188 12/24/2014 0835   TRIG 101.0 12/24/2014 0835   HDL 39.60 12/24/2014 0835   VLDL 20.2 12/24/2014 0835   CHOLHDL 5 12/24/2014 0835   CBC: CBC Latest Ref Rng 12/24/2014  WBC 4.0 - 10.5 K/uL 6.6  Hemoglobin 13.0 - 17.0 g/dL 15.8  Hematocrit 39.0 - 52.0 % 45.8  Platelets 150.0 - 400.0 K/uL 074.6    Basic Metabolic Panel:    Component Value Date/Time   NA 141 12/24/2014 0835   K 4.4 12/24/2014 0835   CL 106 12/24/2014 0835   CO2 30 12/24/2014 0835   BUN 15 12/24/2014 0835   CREATININE 0.98 12/24/2014 0835   GLUCOSE 100* 12/24/2014 0835   CALCIUM 9.1 12/24/2014 0835   Hepatic Function Latest Ref Rng 12/24/2014  Total Protein 6.0 - 8.3 g/dL 7.1  Albumin 3.5 - 5.2 g/dL 4.3  AST 0 - 37 U/L 25  ALT 0 - 53 U/L 24  Alk Phosphatase 39 - 117 U/L 76  Total Bilirubin 0.2 - 1.2 mg/dL 0.3  Bilirubin, Direct 0.0 - 0.3 mg/dL 0.1    No results found for: TSH No results found for: PSA  Assessment and Plan:   Routine general medical examination at a health care facility  Health Maintenance Exam: The patient's preventative maintenance and recommended screening tests for an annual wellness exam were reviewed in full today. Brought up to date unless services declined.  Counselled on the importance of diet, exercise, and its role in overall health and mortality. The patient's FH and SH was reviewed, including their home life, tobacco status, and drug and alcohol status.  Doing very well  Follow-up: Return in 1 year (on 12/31/2015). Unless noted, follow-up in 1 year for Health Maintenance Exam.  Signed,  Frederico Hamman T. Kristin Barcus, MD   Patient's Medications  New Prescriptions   No medications on file    Previous Medications   No medications on file  Modified Medications   No medications on file  Discontinued Medications   SULFAMETHOXAZOLE-TRIMETHOPRIM (BACTRIM DS) 800-160 MG PER TABLET    Take 1 tablet by mouth 2 (two) times daily.

## 2015-01-02 ENCOUNTER — Encounter: Payer: Self-pay | Admitting: Family Medicine

## 2015-11-25 ENCOUNTER — Ambulatory Visit (INDEPENDENT_AMBULATORY_CARE_PROVIDER_SITE_OTHER)
Admission: RE | Admit: 2015-11-25 | Discharge: 2015-11-25 | Disposition: A | Discharge status: Home / Self Care | Source: Ambulatory Visit | Attending: Primary Care | Admitting: Primary Care

## 2015-11-25 ENCOUNTER — Encounter: Payer: Self-pay | Admitting: Primary Care

## 2015-11-25 ENCOUNTER — Ambulatory Visit (INDEPENDENT_AMBULATORY_CARE_PROVIDER_SITE_OTHER): Admitting: Primary Care

## 2015-11-25 VITALS — BP 112/70 | HR 68 | Temp 98.1°F | Ht 68.5 in | Wt 190.4 lb

## 2015-11-25 DIAGNOSIS — M542 Cervicalgia: Secondary | ICD-10-CM

## 2015-11-25 NOTE — Patient Instructions (Signed)
Complete xray(s) prior to leaving today. I will notify you of your results once received.  Complete stretches as discussed and try to work on stress reduction.  Please notify us if your pain becomes worse and/or more frequent.  It was a pleasure meeting you!  Cervical Radiculopathy Cervical radiculopathy happens when a nerve in the neck (cervical nerve) is pinched or bruised. This condition can develop because of an injury or as part of the normal aging process. Pressure on the cervical nerves can cause pain or numbness that runs from the neck all the way down into the arm and fingers. Usually, this condition gets better with rest. Treatment may be needed if the condition does not improve.  CAUSES This condition may be caused by:  Injury.  Slipped (herniated) disk.  Muscle tightness in the neck because of overuse.  Arthritis.  Breakdown or degeneration in the bones and joints of the spine (spondylosis) due to aging.  Bone spurs that may develop near the cervical nerves. SYMPTOMS Symptoms of this condition include:  Pain that runs from the neck to the arm and hand. The pain can be severe or irritating. It may be worse when the neck is moved.  Numbness or weakness in the affected arm and hand. DIAGNOSIS This condition may be diagnosed based on symptoms, medical history, and a physical exam. You may also have tests, including:  X-rays.  CT scan.  MRI.  Electromyogram (EMG).  Nerve conduction tests. TREATMENT In many cases, treatment is not needed for this condition. With rest, the condition usually gets better over time. If treatment is needed, options may include:  Wearing a soft neck collar for short periods of time.  Physical therapy to strengthen your neck muscles.  Medicines, such as NSAIDs, oral corticosteroids, or spinal injections.  Surgery. This may be needed if other treatments do not help. Various types of surgery may be done depending on the cause of your  problems. HOME CARE INSTRUCTIONS Managing Pain  Take over-the-counter and prescription medicines only as told by your health care provider.  If directed, apply ice to the affected area.  Put ice in a plastic bag.  Place a towel between your skin and the bag.  Leave the ice on for 20 minutes, 2-3 times per day.  If ice does not help, you can try using heat. Take a warm shower or warm bath, or use a heat pack as told by your health care provider.  Try a gentle neck and shoulder massage to help relieve symptoms. Activity  Rest as needed. Follow instructions from your health care provider about any restrictions on activities.  Do stretching and strengthening exercises as told by your health care provider or physical therapist. General Instructions  If you were given a soft collar, wear it as told by your health care provider.  Use a flat pillow when you sleep.  Keep all follow-up visits as told by your health care provider. This is important. SEEK MEDICAL CARE IF:  Your condition does not improve with treatment. SEEK IMMEDIATE MEDICAL CARE IF:  Your pain gets much worse and cannot be controlled with medicines.  You have weakness or numbness in your hand, arm, face, or leg.  You have a high fever.  You have a stiff, rigid neck.  You lose control of your bowels or your bladder (have incontinence).  You have trouble with walking, balance, or speaking.   This information is not intended to replace advice given to you by your health  care provider. Make sure you discuss any questions you have with your health care provider.   Document Released: 02/07/2001 Document Revised: 02/03/2015 Document Reviewed: 07/09/2014 Elsevier Interactive Patient Education [employer].

## 2015-11-25 NOTE — Progress Notes (Signed)
   Subjective:    Patient ID: Troy Sims, male    DOB: [birth year], 45 y.o.   MRN: [id number]  HPI  Mr. Troy Sims is a 45 year old male who presents today with a chief complaint of neck pain. His pain is located to the lower cervical spine with radiation to right scapula. This has been present intermittently for the past 6 weeks which will last for seconds at time. Denies recent neck, shoulder, back injury, numbness/tingling, radiculopathy to upper extremities. He played football in high school, lacross in college. He is under an increased amount of stress with work.   Review of Systems  Musculoskeletal: Positive for neck pain. Negative for back pain and neck stiffness.  Neurological: Negative for dizziness and numbness.       Past Medical History  Diagnosis Date  . Tibial fracture   . ACL (anterior cruciate ligament) rupture   . Allergic rhinitis due to pollen 09/26/2010     Social History   Social History  . Marital Status: Single    Spouse Name: N/A  . Number of Children: N/A  . Years of Education: N/A   Occupational History  . truck driver    Social History Main Topics  . Smoking status: Never Smoker   . Smokeless tobacco: Never Used  . Alcohol Use: No  . Drug Use: No  . Sexual Activity: Not on file   Other Topics Concern  . 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 Knee arthroscopy w/ acl reconstruction      Family History  Problem Relation Age of Onset  . Cancer Mother     breast    No Known Allergies  No current outpatient prescriptions on file prior to visit.   No current facility-administered medications on file prior to visit.    BP 112/70 mmHg  Pulse 68  Temp(Src) 98.1 F (36.7 C) (Oral)  Ht 5' 8.5" (1.74 m)  Wt 190 lb 6.4 oz (86.365 kg)  BMI 28.53 kg/m2  SpO2  97%    Objective:   Physical Exam  Constitutional: He appears well-nourished.  Neck: Normal range of motion. Neck supple.  Cardiovascular: Normal rate and regular rhythm.   Pulmonary/Chest: Effort normal and breath sounds normal.  Musculoskeletal:       Cervical back: He exhibits normal range of motion, no tenderness, no swelling and no spasm.  Skin: Skin is warm and dry.          Assessment & Plan:  Cervical Radiculopathy:  Originates to lower C-spine with radiation to right scapula. Did play impact sports in high school and college, no recent injury. Suspect pinched nerve that is likely originating from cervical spine. Will obtain c-spine imaging for further evaluation. Stretching exercises provided. If symptoms persist then will need to send to PT.

## 2015-11-25 NOTE — Progress Notes (Signed)
Pre visit review using our clinic review tool, if applicable. No additional management support is needed unless otherwise documented below in the visit note. 

## 2015-11-29 ENCOUNTER — Other Ambulatory Visit: Payer: Self-pay | Admitting: Family Medicine

## 2015-11-29 MED ORDER — PREDNISONE 20 MG PO TABS: ORAL_TABLET | ORAL | Status: DC

## 2015-11-29 NOTE — Telephone Encounter (Signed)
See results note from x-ray on 11/25/2015.  Earl LitesGregory wants to call me back with pharmacy name.

## 2015-11-29 NOTE — Telephone Encounter (Signed)
Pt returned your call.  

## 2016-02-14 ENCOUNTER — Encounter: Payer: Self-pay | Admitting: Family Medicine

## 2016-02-14 ENCOUNTER — Ambulatory Visit (INDEPENDENT_AMBULATORY_CARE_PROVIDER_SITE_OTHER): Admitting: Family Medicine

## 2016-02-14 VITALS — BP 108/76 | HR 71 | Temp 98.3°F | Ht 68.5 in | Wt 192.8 lb

## 2016-02-14 DIAGNOSIS — R350 Frequency of micturition: Secondary | ICD-10-CM

## 2016-02-14 DIAGNOSIS — N401 Enlarged prostate with lower urinary tract symptoms: Secondary | ICD-10-CM

## 2016-02-14 DIAGNOSIS — N138 Other obstructive and reflux uropathy: Secondary | ICD-10-CM

## 2016-02-14 LAB — POC URINALSYSI DIPSTICK (AUTOMATED)
BILIRUBIN UA: NEGATIVE
GLUCOSE UA: NEGATIVE
Ketones, UA: NEGATIVE
Leukocytes, UA: NEGATIVE
NITRITE UA: NEGATIVE
Protein, UA: NEGATIVE
RBC UA: NEGATIVE
Spec Grav, UA: >=1.03
Urobilinogen, UA: 0.2
pH, UA: 6

## 2016-02-14 LAB — PSA: PSA: 0.58 ng/mL (ref 0.10–4.00)

## 2016-02-14 NOTE — Progress Notes (Signed)
Dr. Karleen Hampshire T. Purl Claytor, MD, CAQ Sports Medicine Primary Care and Sports Medicine [address] [place], [address] Phone: [phone] Fax: [phone]  02/14/2016  Patient: Troy Sims, MRN: [id number], DOB: March 06, 1971, 45 y.o.  Primary Physician:  Hannah Beat, MD   Chief Complaint  Patient presents with  . Urinary Frequency   Subjective:   Troy Sims is a 45 y.o. very pleasant male patient who presents with the following:  Waking up in the middle of the night and having to go to the bathroom. Last night did not wake up at all. Once or twice.   No pain or blood.  More fish and fiber now.   Past Medical History, Surgical History, Social History, Family History, Problem List, Medications, and Allergies have been reviewed and updated if relevant.  Patient Active Problem List   Diagnosis Date Noted  . Allergic rhinitis due to pollen 09/26/2010    Past Medical History:  Diagnosis Date  . ACL (anterior cruciate ligament) rupture   . Allergic rhinitis due to pollen 09/26/2010  . Tibial fracture     Past Surgical History:  Procedure Laterality Date  . KNEE ARTHROSCOPY W/ ACL RECONSTRUCTION      Social History   Social History  . Marital status: Single    Spouse name: N/A  . Number of children: N/A  . 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 Smoking status: Never Smoker  . Smokeless tobacco: Never Used  . Alcohol use No  . Drug use: No  . Sexual activity: Not on file   Other Topics Concern  . 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 Cancer Mother     breast    No Known Allergies  Medication list reviewed and updated in full in [facility].   GEN: No acute illnesses, no fevers, chills. GI:  No n/v/d, eating normally Pulm: No SOB Interactive and getting along well at home.  Otherwise, ROS is as per the HPI.  Objective:   BP 108/76   Pulse 71   Temp 98.3 F (36.8 C) (Oral)   Ht 5' 8.5" (1.74 m)   Wt 192 lb 12 oz (87.4 kg)   BMI 28.88 kg/m   GEN: WDWN, NAD, Non-toxic, A & O x 3 HEENT: Atraumatic, Normocephalic. Neck supple. No masses, No LAD. Ears and Nose: No external deformity. CV: RRR, No M/G/R. No JVD. No thrill. No extra heart sounds. PULM: CTA B, no wheezes, crackles, rhonchi. No retractions. No resp. distress. No accessory muscle use. EXTR: No c/c/e NEURO Normal gait.  PSYCH: Normally interactive. Conversant. Not depressed or anxious appearing.  Calm demeanor.   Laboratory and Imaging Data: Results for orders placed or performed in visit on 02/14/16  POCT Urinalysis Dipstick (Automated)  Result Value Ref Range   Color, UA yellow    Clarity, UA clear    Glucose, UA negative    Bilirubin, UA negative    Ketones, UA negative    Spec Grav, UA >=1.030    Blood, UA negative    pH, UA 6.0    Protein, UA negative    Urobilinogen, UA  0.2    Nitrite, UA negative    Leukocytes, UA Negative Negative     Assessment and Plan:   BPH with obstruction/lower urinary tract symptoms - Plan: PSA  Urinary frequency - Plan: POCT Urinalysis Dipstick (Automated)  Probable BPH, reassurance. Check PSA  Follow-up: No Follow-up on file.  Orders Placed This Encounter  Procedures  . PSA  . POCT Urinalysis Dipstick (Automated)    Signed,  Kinisha Soper T. Terrance Usery, MD   Patient's Medications  New Prescriptions   No medications on file  Previous Medications   No medications on file  Modified Medications   No medications on file  Discontinued Medications   PREDNISONE (DELTASONE) 20 MG TABLET    Take 2 tablets by mouth x 5 days, then 1 tablet by mouth x 5 days

## 2016-02-14 NOTE — Progress Notes (Signed)
Pre visit review using our clinic review tool, if applicable. No additional management support is needed unless otherwise documented below in the visit note. 

## 2017-02-26 ENCOUNTER — Ambulatory Visit: Payer: Self-pay | Admitting: Family Medicine

## 2017-02-26 ENCOUNTER — Ambulatory Visit (INDEPENDENT_AMBULATORY_CARE_PROVIDER_SITE_OTHER)

## 2017-02-26 ENCOUNTER — Ambulatory Visit (INDEPENDENT_AMBULATORY_CARE_PROVIDER_SITE_OTHER): Admitting: Sports Medicine

## 2017-02-26 DIAGNOSIS — M25562 Pain in left knee: Secondary | ICD-10-CM | POA: Diagnosis not present

## 2017-02-26 DIAGNOSIS — G8929 Other chronic pain: Secondary | ICD-10-CM

## 2017-02-26 MED ORDER — DICLOFENAC SODIUM 2 % TD SOLN: 1.0000 "application " | Freq: Two times a day (BID) | TRANSDERMAL | 0 refills | Status: AC

## 2017-02-26 MED ORDER — DICLOFENAC SODIUM 2 % TD SOLN: 1.0000 "application " | Freq: Two times a day (BID) | TRANSDERMAL | 2 refills | Status: DC

## 2017-02-26 NOTE — Progress Notes (Signed)
OFFICE VISIT NOTE Troy Sims. Troy Sims Sports Medicine [facility] [phone]  Troy Sims - 46 y.o. male MRN [id number]  Date of birth: 12-17-1970  Visit Date: 02/26/2017  PCP: Hannah Beat, MD   Referred by: Hannah Beat, MD  Orlie Dakin, CMA acting as scribe for Dr. Berline Chough.  SUBJECTIVE:   Chief Complaint  Patient presents with  . New Patient (Initial Visit)    LT knee pain   HPI: As below and per problem based documentation when appropriate.  Troy Sims is a new patient presenting today for evaluation of LT knee pain.  He injured his knee in 2008 while playing flag football.  Tibia plateau fracture, torn ALC; MRI done 05/20/2007  The pain is described as aching and is rated as 5/10 after a day or cardio.  Worsened after high impact exercising. He has noticed that lately his knee has been popping more, there is no pain associated with this.  Improves with rest Therapies tried include : Advil with some relief. He has been limiting jogging, trying to do more walking and biking. He had ACL reconstruction and has been to PT in the past.  Other associated symptoms include: No radiation of pain into the upper or lower leg, minimal swelling around the knee. He ices the knee when swelling is present. He has some pain/discomfort in the LT hip.     Review of Systems  Constitutional: Negative.   HENT: Negative.   Eyes: Negative.   Respiratory: Negative.   Cardiovascular: Negative.   Gastrointestinal: Negative.   Genitourinary: Negative.   Musculoskeletal: Positive for joint pain.  Skin: Negative.   Neurological: Negative for dizziness, tingling and headaches.  Endo/Heme/Allergies: Positive for environmental allergies.    Otherwise per HPI.  HISTORY & PERTINENT PRIOR DATA:  No specialty comments available. He reports that he has never smoked. He has never used smokeless tobacco. No results for input(s): HGBA1C, LABURIC in  the last 8760 hours. Allergies reviewed per EMR Prior to Admission medications   Medication Sig Start Date End Date Taking? Authorizing Provider  fluticasone (FLONASE) 50 MCG/ACT nasal spray  12/04/16  Yes [provider]  Diclofenac Sodium (PENNSAID) 2 % SOLN Place 1 application onto the skin 2 (two) times daily. 02/26/17   Andrena Mews, DO   Patient Active Problem List   Diagnosis Date Noted  . Chronic pain of left knee 03/21/2017  . Allergic rhinitis due to pollen 09/26/2010   Past Medical History:  Diagnosis Date  . ACL (anterior cruciate ligament) rupture   . Allergic rhinitis due to pollen 09/26/2010  . Tibial fracture    Family History  Problem Relation Age of Onset  . Cancer Mother        breast   Past Surgical History:  Procedure Laterality Date  . 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 Smoking status: Never Smoker  . Smokeless tobacco: Never Used  . Alcohol use No  . Drug use: No  . Sexual activity: Not on file    OBJECTIVE:  VS:  HT:    WT:   BMI:     BP:   HR: bpm  TEMP: ( )  RESP:  EXAM: Findings:  WDWN, NAD, Non-toxic appearing Alert & appropriately interactive Not depressed or anxious appearing No increased work of breathing. Pupils are equal.  EOM intact without nystagmus No clubbing or cyanosis of the extremities appreciated No significant rashes/lesions/ulcerations overlying the examined area.  no significant pretibial edema.  No clubbing or cyanosis Sensation intact to light touch in lower extremities.  Left Knee: Overall joint is well aligned, no significant deformity.   No significant effusion.   Small amount of synovitis ROM: 0 to 120.   Extensor mechanism intact No significant medial or lateral joint line tenderness.   Stable to varus/valgus strain & anterior/posterior drawer.  Normal Lachman's.   Negative  McMurray's and Thessaly.      RADIOLOGY: DG Knee AP/LAT W/Sunrise Left CLINICAL DATA:  46 year old male worsening chronic left knee pain. No recent injury. Tibial plateau fracture 10 years ago. Initial encounter.  EXAM: LEFT KNEE 3 VIEWS  COMPARISON:  05/20/2007 MR.  FINDINGS: Prior procedure left knee with tunnel extending from lateral femoral metaphysis to medial tibial metaphysis with anchors in place.  Very mild bilateral lateral tibiofemoral joint space narrowing  No fracture or dislocation.  IMPRESSION: Postoperative changes left knee.  Very mild bilateral lateral tibiofemoral joint space narrowing.  Electronically Signed   By: Lacy Duverney M.D.   On: 02/27/2017 08:39  ASSESSMENT & PLAN:     ICD-10-CM   1. Chronic pain of left knee M25.562 DG Knee AP/LAT W/Sunrise Left   G89.29 Diclofenac Sodium (PENNSAID) 2 % SOLN   ================================================================= Chronic pain of left knee Remarkably only very mild tibiofemoral joint space narrowing following prior tibial plateau fracture.  He has a very small amount of synovitis today likely benefit from topical Pennsaid and a prescription for this was provided.  Sounds as though he may be developing some effusion with activity and recommend compression continue therapeutic exercises.  Follow-up if any lack of improvement.  No notes on file ================================================================= Patient Instructions    I recommend you obtained a compression sleeve to help with your joint problems. There are many options on the market however I recommend obtaining a full knee Body Helix compression sleeve.  You can find information (including how to appropriate measure yourself for sizing) can be found at www.Body [employer].ch.  Many of these products are health savings account (HSA) eligible.   You can use the compression sleeve at any time throughout the day but is most important to use  while being active as well as for 2 hours post-activity.   It is appropriate to ice following activity with the compression sleeve in place.   ================================================================= No future appointments.  Follow-up: Return if symptoms worsen or fail to improve.   CMA/ATC served as Neurosurgeon during this visit. History, Physical, and Plan performed by medical provider. Documentation and orders reviewed and attested to.      Gaspar Bidding, DO    [facility] Physician

## 2017-02-26 NOTE — Patient Instructions (Addendum)
I recommend you obtained a compression sleeve to help with your joint problems. There are many options on the market however I recommend obtaining a full knee Body Helix compression sleeve.  You can find information (including how to appropriate measure yourself for sizing) can be found at www.Body Helix.com.  Many of these products are health savings account (HSA) eligible.   You can use the compression sleeve at any time throughout the day but is most important to use while being active as well as for 2 hours post-activity.   It is appropriate to ice following activity with the compression sleeve in place.   

## 2017-03-14 ENCOUNTER — Ambulatory Visit (INDEPENDENT_AMBULATORY_CARE_PROVIDER_SITE_OTHER)

## 2017-03-14 DIAGNOSIS — Z23 Encounter for immunization: Secondary | ICD-10-CM

## 2017-03-21 DIAGNOSIS — G8929 Other chronic pain: Secondary | ICD-10-CM | POA: Insufficient documentation

## 2017-03-21 DIAGNOSIS — M25562 Pain in left knee: Principal | ICD-10-CM

## 2017-03-21 NOTE — Assessment & Plan Note (Signed)
Remarkably only very mild tibiofemoral joint space narrowing following prior tibial plateau fracture.  He has a very small amount of synovitis today likely benefit from topical Pennsaid and a prescription for this was provided.  Sounds as though he may be developing some effusion with activity and recommend compression continue therapeutic exercises.  Follow-up if any lack of improvement.

## 2017-08-27 ENCOUNTER — Encounter: Payer: Self-pay | Admitting: Family Medicine

## 2017-08-27 ENCOUNTER — Other Ambulatory Visit: Payer: Self-pay

## 2017-08-27 ENCOUNTER — Ambulatory Visit (INDEPENDENT_AMBULATORY_CARE_PROVIDER_SITE_OTHER): Admitting: Family Medicine

## 2017-08-27 VITALS — BP 110/64 | HR 76 | Temp 98.2°F | Ht 68.0 in | Wt 197.8 lb

## 2017-08-27 DIAGNOSIS — M7711 Lateral epicondylitis, right elbow: Secondary | ICD-10-CM

## 2017-08-27 DIAGNOSIS — M7701 Medial epicondylitis, right elbow: Secondary | ICD-10-CM

## 2017-08-27 NOTE — Progress Notes (Signed)
Dr. Karleen Hampshire T. Azure Budnick, MD, CAQ Sports Medicine Primary Care and Sports Medicine [address] [place], [address] Phone: [phone] Fax: [phone]  08/27/2017  Patient: Troy Sims, MRN: [id number], DOB: [birth year], 47 y.o.  Primary Physician:  Hannah Beat, MD   Chief Complaint  Patient presents with  . Elbow Pain    Right   Subjective:   Troy Sims presents with lateral elbow pain.  Length of symptoms: 3 weeks Hand effected: R  Patient describes a dull ache on the lateral elbow. There is some translation in the proximal forearm and in the distal upper arm. It is painful to lift with the hand facing down and to lift with the thumb in an upright position. Supination is painful. Patient points to the lateral epicondyle as the point of maximal tenderness near ECRB.  No trauma. Recent hammer use  No prior fractures or operative interventions in the effective hand. Prior PT or HEP: none  Denies numbness or tingling. No significant neck or shoulder pain.  Hand of dominance:  R  The PMH, PSH, Social History, Family History, Medications, and allergies have been reviewed in [facility], and have been updated if relevant.  Patient Active Problem List   Diagnosis Date Noted  . Chronic pain of left knee 03/21/2017  . Allergic rhinitis due to pollen 09/26/2010    Past Medical History:  Diagnosis Date  . ACL (anterior cruciate ligament) rupture   . Allergic rhinitis due to pollen 09/26/2010  . Tibial fracture     Past Surgical History:  Procedure Laterality Date  . KNEE ARTHROSCOPY W/ ACL RECONSTRUCTION      Social History   Socioeconomic History  . Marital status: Single    Spouse name: Not on file  . Number of children: Not on file  . Years of education: Not on file  . Highest education level: Not on file  Occupational History  . Occupation: truck Air traffic controller: [employer]  Social Needs  . Financial resource strain: Not on file  . Food  insecurity:    Worry: Not on file    Inability: Not on file  . Transportation needs:    Medical: Not on file    Non-medical: Not on file  Tobacco Use  . Smoking status: Never Smoker  . Smokeless tobacco: Never Used  Substance and Sexual Activity  . Alcohol use: No  . Drug use: No  . Sexual activity: Not on file  Lifestyle  . Physical activity:    Days per week: Not on file    Minutes per session: Not on file  . Stress: Not on file  Relationships  . Social connections:    Talks on phone: Not on file    Gets together: Not on file    Attends religious service: Not on file    Active member of club or organization: Not on file    Attends meetings of clubs or organizations: Not on file    Relationship status: Not on file  . Intimate partner violence:    Fear of current or ex partner: Not on file    Emotionally abused: Not on file    Physically abused: Not on file    Forced sexual activity: Not on file  Other Topics Concern  . 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 Cancer Mother        breast    No Known Allergies  Medication list reviewed and updated in full in [facility].  GEN: No fevers, chills. Nontoxic. Primarily MSK c/o today. MSK: Detailed in the HPI GI: tolerating PO intake without difficulty Neuro: No numbness, parasthesias, or tingling associated. Otherwise the pertinent positives of the ROS are noted above.   Objective:   Blood pressure 110/64, pulse 76, temperature 98.2 F (36.8 C), temperature source Oral, height 5\' 8"  (1.727 m), weight 197 lb 12 oz (89.7 kg).  GEN: Well-developed,well-nourished,in no acute distress; alert,appropriate and cooperative throughout examination HEENT: Normocephalic and atraumatic without obvious abnormalities. Ears, externally no deformities PULM:  Breathing comfortably in no respiratory distress EXT: No clubbing, cyanosis, or edema PSYCH: Normally interactive. Cooperative during the interview. Pleasant. Friendly and conversant. Not anxious or depressed appearing. Normal, full affect.  R elbow Ecchymosis or edema: neg ROM: full flexion, extension, pronation, supination Shoulder ROM: Full Flexion: 5/5 Extension: 5/5, PAINFUL Supination: 5/5, PAINFUL Pronation: 5/5 Wrist ext: 5/5 Wrist flexion: 5/5 No gross bony abnormality Varus and Valgus stress: stable ECRB tenderness: YES, TTP Medial epicondyle: mild ttp Lateral epicondyle, resisted wrist extension from wrist full pronation and flexion: PAINFUL grip: 5/5  sensation intact Tinel's, Elbow: negative  Subjective:   Lateral epicondylitis, right elbow  Medial epicondylitis, right  Elbow anatomy was reviewed, and tendinopathy was explained. Pt[facility] elbow [facility]habil[facility].  Series of concentric and eccentric exercises should be done starting with no weight, work up to 1 lb, hammer, etc.  Use counterforce strap if working or using hands.  Formal PT would be beneficial. Emphasized stretching an cross-friction massage Emphasized proper palms up lifting biomechanics to unload ECRB  Follow-up: No follow-ups on file.  Signed,  Elpidio GaleaSpencer T. Teancum Brule, MD   Patient's Medications  New Prescriptions   No medications on file  Previous Medications   No medications on file  Modified Medications   No medications on file  Discontinued Medications   DICLOFENAC SODIUM (PENNSAID) 2 % SOLN    Place 1 application onto the skin 2 (two) times daily.   FLUTICASONE (FLONASE) 50 MCG/ACT NASAL SPRAY

## 2017-11-15 ENCOUNTER — Ambulatory Visit (INDEPENDENT_AMBULATORY_CARE_PROVIDER_SITE_OTHER): Admitting: Family Medicine

## 2017-11-15 ENCOUNTER — Encounter: Payer: Self-pay | Admitting: Family Medicine

## 2017-11-15 ENCOUNTER — Ambulatory Visit (INDEPENDENT_AMBULATORY_CARE_PROVIDER_SITE_OTHER)
Admission: RE | Admit: 2017-11-15 | Discharge: 2017-11-15 | Disposition: A | Discharge status: Home / Self Care | Source: Ambulatory Visit | Attending: Family Medicine | Admitting: Family Medicine

## 2017-11-15 VITALS — BP 122/84 | HR 69 | Temp 98.6°F | Ht 68.0 in | Wt 193.5 lb

## 2017-11-15 DIAGNOSIS — M25561 Pain in right knee: Secondary | ICD-10-CM

## 2017-11-15 DIAGNOSIS — M6751 Plica syndrome, right knee: Secondary | ICD-10-CM | POA: Diagnosis not present

## 2017-11-15 DIAGNOSIS — M7651 Patellar tendinitis, right knee: Secondary | ICD-10-CM

## 2017-11-15 MED ORDER — NITROGLYCERIN 0.2 MG/HR TD PT24: MEDICATED_PATCH | TRANSDERMAL | 4 refills | Status: DC

## 2017-11-15 NOTE — Patient Instructions (Addendum)
PLICA BAND or PLICA on the knee  Band of tissue / Kink in the synovial lining. The joint lining.  Use your fingers a few times a day for the next few weeks to massage that area to help break down the tissue. You can also use an ice cube.    Nitroglycerin Protocol   Apply 1/4 nitroglycerin patch to affected area daily.  Change position of patch within the affected area every 24 hours.  You may experience a headache during the first 1-2 weeks of using the patch, these should subside.  If you experience headaches after beginning nitroglycerin patch treatment, you may take your preferred over the counter pain reliever.  Another side effect of the nitroglycerin patch is skin irritation or rash related to patch adhesive.  Please notify our office if you develop more severe headaches or rash, and stop the patch.  Tendon healing with nitroglycerin patch may require 12 to 24 weeks depending on the extent of injury.  Men should not use if taking Viagra, Cialis, or Levitra.   Do not use if you have migraines or rosacea.

## 2017-11-15 NOTE — Progress Notes (Signed)
Dr. Karleen HampshireSpencer T. Orville Widmann, MD, CAQ Sports Medicine Primary Care and Sports Medicine [address] [place], [address] Phone: [phone] Fax: [phone]  11/15/2017  Patient: Troy RicherGregory O Willig, MRN: [id number], DOB: 01-Oct-1970, 47 y.o.  Primary Physician:  Hannah Beatopland, Remmie Bembenek, MD   Chief Complaint  Patient presents with  . Knee Pain    Right   Subjective:   Troy Sims is a 47 y.o. very pleasant male patient who presents with the following:  R knee pain: Troy Sims is a very well-known patient who I have known for many years and he presents with right-sided knee pain.  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  Walking 3 - 4 miles, had some swelling. No clicking or popping. Did 2 weeks of active duty, could still work out.  He does do some weight lifting, but he has decreased his weights compared to prior years. Air squats and lunges with no weights.   He is having anterior knee pain, and is having some occasional pain medially and pain underneath the kneecap.  He has not had any mechanical buckling.  No locking up.  He does have a history of a ACL reconstruction on the left distantly.  Patellar tendonitis R Plica, medial  Past Medical History, Surgical History, Social History, Family History, Problem List, Medications, and Allergies have been reviewed and updated if relevant.  Patient Active Problem List   Diagnosis Date Noted  . Chronic pain of left knee 03/21/2017  . Allergic rhinitis due to pollen 09/26/2010    Past Medical History:  Diagnosis Date  . ACL (anterior cruciate ligament) rupture   . Allergic rhinitis due to pollen 09/26/2010  . Tibial fracture     Past Surgical History:  Procedure Laterality Date  . KNEE ARTHROSCOPY W/ ACL RECONSTRUCTION      Social History   Socioeconomic History  . Marital status: Single    Spouse name: Not on file  . Number of children: Not  on file  . Years of education: Not on file  . Highest education level: Not on file  Occupational History  . Occupation: truck Air traffic controllerdriver    Employer: [employer]  Social Needs  . Financial resource strain: Not on file  . Food insecurity:    Worry: Not on file    Inability: Not on file  . Transportation needs:    Medical: Not on file    Non-medical: Not on file  Tobacco Use  . Smoking status: Never Smoker  . Smokeless tobacco: Never Used  Substance and Sexual Activity  . Alcohol use: No  . Drug use: No  . Sexual activity: Not on file  Lifestyle  . Physical activity:    Days per week: Not on file    Minutes per session: Not on file  . Stress: Not on file  Relationships  . Social connections:    Talks on phone: Not on file    Gets together: Not on file    Attends religious service: Not on file    Active member of club or organization: Not on file    Attends meetings of clubs or organizations: Not on file    Relationship status: Not on file  . Intimate partner violence:    Fear of current or ex partner: Not on file    Emotionally abused: Not on file  Physically abused: Not on file    Forced sexual activity: Not on file  Other Topics Concern  . 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 Cancer Mother        breast    No Known Allergies  Medication list reviewed and updated in full in [facility].  GEN: No fevers, chills. Nontoxic. Primarily MSK c/o today. MSK: Detailed in the HPI GI: tolerating PO intake without difficulty Neuro: No numbness, parasthesias, or tingling associated. Otherwise the pertinent positives of the ROS are noted above.   Objective:   BP 122/84   Pulse 69   Temp 98.6 F (37 C) (Oral)   Ht 5\' 8"  (1.727 m)   Wt 193 lb 8 oz (87.8 kg)   BMI 29.42 kg/m    GEN: WDWN,  NAD, Non-toxic, Alert & Oriented x 3 HEENT: Atraumatic, Normocephalic.  Ears and Nose: No external deformity. EXTR: No clubbing/cyanosis/edema NEURO: Normal gait.  PSYCH: Normally interactive. Conversant. Not depressed or anxious appearing.  Calm demeanor.   Knee:  R Gait: Normal heel toe pattern ROM: 0-125 Effusion: minimal  Echymosis or edema: none Patellar tendon ttp more proximally Painful PLICA: medial Patellar grind: negative Medial and lateral patellar facet loading: negative medial and lateral joint lines:NT Mcmurray's neg Flexion-pinch neg Varus and valgus stress: stable Lachman: neg Ant and Post drawer: neg Hip abduction, IR, ER: WNL Hip flexion str: 5/5 Hip abd: 5/5 Quad: 5/5 VMO atrophy:No Hamstring concentric and eccentric: 5/5   Radiology: Dg Knee 4 Views W/patella Right  Result Date: 11/15/2017 CLINICAL DATA:  Acute RIGHT knee pain EXAM: RIGHT KNEE - COMPLETE 4+ VIEW COMPARISON:  None FINDINGS: Osseous mineralization normal. Joint spaces preserved. No acute fracture, dislocation, or bone destruction. No knee joint effusion. Comparison AP view of the LEFT knee demonstrates a large staple at approximately the tibial tubercle with probable mild joint space narrowing lateral compartment. IMPRESSION: No acute RIGHT knee abnormalities. Prior knee surgery and question mild degenerative changes at LEFT knee. Electronically Signed   By: Ulyses Southward M.D.   On: 11/15/2017 14:41    Assessment and Plan:   Patellar tendonitis of right knee  Acute pain of right knee - Plan: DG Knee 4 Views W/Patella Right  Plica of knee, right  Most painful at the patellar tendon with patellar tendinopathy.  We will start him on some classic rehab, as well as a nitroglycerin protocol.  If he has trouble with this her symptoms persist, then formal physical therapy with some iontophoresis would be a good idea.  Also reviewed some conservative management for plica bands.  Follow-up:  prn  Meds ordered this encounter  Medications  . nitroGLYCERIN (NITRODUR - DOSED IN MG/24 HR) 0.2 mg/hr patch    Sig: Apply 1/4 of a patch to the affected area and change every 24 hours (re: tendinopathy)    Dispense:  30 patch    Refill:  4   Orders Placed This Encounter  Procedures  . DG Knee 4 Views W/Patella Right    Signed,  Karleen Hampshire T. Vernecia Umble, MD   Allergies as of 11/15/2017   No Known Allergies     Medication List        Accurate as of 11/15/17 11:59 PM. Always use  your most recent med list.          nitroGLYCERIN 0.2 mg/hr patch Commonly known as:  NITRODUR - Dosed in mg/24 hr Apply 1/4 of a patch to the affected area and change every 24 hours (re: tendinopathy)

## 2018-01-15 ENCOUNTER — Encounter: Payer: Self-pay | Admitting: Family Medicine

## 2018-01-15 NOTE — Progress Notes (Signed)
Dr. Karleen HampshireSpencer T. Ezariah Nace, MD, CAQ Sports Medicine Primary Care and Sports Medicine [address] [place], [address] Phone: [phone] Fax: [phone]  01/16/2018  Patient: Troy RicherGregory O Takacs, MRN: [id number], DOB: [birth year], 47 y.o.  Primary Physician:  Hannah Beatopland, Mikias Lanz, MD   Chief Complaint  Patient presents with  . Snoring    wants to discuss getting a sleep study   Subjective:   Troy Sims is a 47 y.o. very pleasant male patient who presents with the following:  Tammy SoursGreg is a well-known patient, and he is here to follow-up and talk about possibly getting a sleep study for obstructive sleep apnea.  Having some pausing with sleeping - does have some snoring.  Some tiredness during the day  Past Medical History, Surgical History, Social History, Family History, Problem List, Medications, and Allergies have been reviewed and updated if relevant.  Patient Active Problem List   Diagnosis Date Noted  . Chronic pain of left knee 03/21/2017  . Allergic rhinitis due to pollen 09/26/2010    Past Medical History:  Diagnosis Date  . ACL (anterior cruciate ligament) rupture   . Allergic rhinitis due to pollen 09/26/2010  . Tibial fracture     Past Surgical History:  Procedure Laterality Date  . KNEE ARTHROSCOPY W/ ACL RECONSTRUCTION      Social History   Socioeconomic History  . Marital status: Single    Spouse name: Not on file  . Number of children: Not on file  . Years of education: Not on file  . Highest education level: Not on file  Occupational History  . Occupation: truck Air traffic controllerdriver    Employer: [employer]  Social Needs  . Financial resource strain: Not on file  . Food insecurity:    Worry: Not on file    Inability: Not on file  . Transportation needs:    Medical: Not on file    Non-medical: Not on file  Tobacco Use  . Smoking status: Never Smoker  . Smokeless tobacco: Never Used  Substance and Sexual Activity  . Alcohol use: No  . Drug use: No    . Sexual activity: Not on file  Lifestyle  . Physical activity:    Days per week: Not on file    Minutes per session: Not on file  . Stress: Not on file  Relationships  . Social connections:    Talks on phone: Not on file    Gets together: Not on file    Attends religious service: Not on file    Active member of club or organization: Not on file    Attends meetings of clubs or organizations: Not on file    Relationship status: Not on file  . Intimate partner violence:    Fear of current or ex partner: Not on file    Emotionally abused: Not on file    Physically abused: Not on file    Forced sexual activity: Not on file  Other Topics Concern  . 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 Cancer Mother        breast    No Known Allergies  Medication list reviewed and updated in full in [facility].   GEN: No acute illnesses, no fevers, chills.  GI: No n/v/d, eating normally Pulm: No SOB Interactive and getting along well at home.  Otherwise, ROS is as per the HPI.  Objective:   BP 110/70   Pulse 69   Temp 98.7 F (37.1 C) (Oral)   Ht 5\' 8"  (1.727 m)   Wt 192 lb 8 oz (87.3 kg)   BMI 29.27 kg/m   GEN: WDWN, NAD, Non-toxic, A & O x 3 HEENT: Atraumatic, Normocephalic. Neck supple. No masses, No LAD. Ears and Nose: No external deformity. CV: RRR, No M/G/R. No JVD. No thrill. No extra heart sounds. PULM: CTA B, no wheezes, crackles, rhonchi. No retractions. No resp. distress. No accessory muscle use. EXTR: No c/c/e NEURO Normal gait.  PSYCH: Normally interactive. Conversant. Not depressed or anxious appearing.  Calm demeanor.   Laboratory and Imaging Data:  Assessment and Plan:   OSA (obstructive sleep apnea) - Plan: Ambulatory referral to Pulmonology  Check for OSA  Follow-up: No follow-ups  on file.  Orders Placed This Encounter  Procedures  . Ambulatory referral to Pulmonology    Signed,  Karleen HampshireSpencer T. Nechelle Petrizzo, MD   Allergies as of 01/16/2018   No Known Allergies     Medication List    as of 01/16/2018  8:28 AM   You have not been prescribed any medications.

## 2018-01-16 ENCOUNTER — Encounter: Payer: Self-pay | Admitting: Family Medicine

## 2018-01-16 ENCOUNTER — Ambulatory Visit (INDEPENDENT_AMBULATORY_CARE_PROVIDER_SITE_OTHER): Admitting: Family Medicine

## 2018-01-16 VITALS — BP 110/70 | HR 69 | Temp 98.7°F | Ht 68.0 in | Wt 192.5 lb

## 2018-01-16 DIAGNOSIS — G4733 Obstructive sleep apnea (adult) (pediatric): Secondary | ICD-10-CM | POA: Diagnosis not present

## 2018-01-16 NOTE — Patient Instructions (Signed)
REFERRALS TO SPECIALISTS, SPECIAL TESTS (MRI, CT, ULTRASOUNDS)  MARION or  Anastasiya will help you. ASK CHECK-IN FOR HELP.  Specialist appointment times vary a great deal, based on their schedule / openings. -- Some specialists have very long wait times. (Example. Dermatology)    

## 2018-01-30 ENCOUNTER — Institutional Professional Consult (permissible substitution): Admitting: Pulmonary Disease

## 2018-02-01 ENCOUNTER — Ambulatory Visit (INDEPENDENT_AMBULATORY_CARE_PROVIDER_SITE_OTHER): Admitting: Pulmonary Disease

## 2018-02-01 ENCOUNTER — Encounter: Payer: Self-pay | Admitting: Pulmonary Disease

## 2018-02-01 VITALS — BP 130/94 | HR 71 | Ht 69.0 in | Wt 195.2 lb

## 2018-02-01 DIAGNOSIS — R0683 Snoring: Secondary | ICD-10-CM

## 2018-02-01 MED ORDER — MONTELUKAST SODIUM 10 MG PO TABS: 10.0000 mg | ORAL_TABLET | Freq: Every day | ORAL | 3 refills | Status: DC

## 2018-02-01 NOTE — Progress Notes (Signed)
Troy Sims    [id number]    1970/09/28  Primary Care Physician:Copland, Karleen Hampshire, MD  Referring Physician: Hannah Beat, MD [address] [place], [place] [address]  Chief complaint:   Snoring Witnessed apneas  HPI: History of snoring-worsened lately Spouse has witnessed apnea He sometimes wakes up with a snort No dry throat Very occasional sweating at night, no nocturia Wakes up feeling like is not a good nights rest on most nights Denies any issues with his memory or concentration No near misses Goes to bed between 10 and 11 PM Takes him about 10 to 15 minutes to fall asleep Usually gets up about 7-8AM No significant weight change Usually very active  He has snored since childhood  Occupation: No pertinent occupational history Exposures: Significant exposures Smoking history: Non-smoker   No outpatient encounter medications on file as of 02/01/2018.   No facility-administered encounter medications on file as of 02/01/2018.     Allergies as of 02/01/2018  . (No Known Allergies)    Past Medical History:  Diagnosis Date  . ACL (anterior cruciate ligament) rupture   . Allergic rhinitis due to pollen 09/26/2010  . Tibial fracture     Past Surgical History:  Procedure Laterality Date  . KNEE ARTHROSCOPY W/ ACL RECONSTRUCTION      Family History  Problem Relation Age of Onset  . Cancer Mother        breast    Social History   Socioeconomic History  . Marital status: Single    Spouse name: Not on file  . Number of children: Not on file  . Years of education: Not on file  . Highest education level: Not on file  Occupational History  . Occupation: truck Air traffic controller: [employer]  Social Needs  . Financial resource strain: Not on file  . Food insecurity:    Worry: Not on file    Inability: Not on file  . Transportation needs:    Medical: Not on file    Non-medical: Not on file  Tobacco Use  . Smoking status: Never  Smoker  . Smokeless tobacco: Never Used  Substance and Sexual Activity  . Alcohol use: No  . Drug use: No  . Sexual activity: Not on file  Lifestyle  . Physical activity:    Days per week: Not on file    Minutes per session: Not on file  . Stress: Not on file  Relationships  . Social connections:    Talks on phone: Not on file    Gets together: Not on file    Attends religious service: Not on file    Active member of club or organization: Not on file    Attends meetings of clubs or organizations: Not on file    Relationship status: Not on file  . Intimate partner violence:    Fear of current or ex partner: Not on file    Emotionally abused: Not on file    Physically abused: Not on file    Forced sexual activity: Not on file  Other Topics Concern  . 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   Musculoskeletal: Negative.   Skin: Negative.   Psychiatric/Behavioral: Positive for sleep  disturbance. Negative for decreased concentration and dysphoric mood.  All other systems reviewed and are negative.   There were no vitals filed for this visit.   Physical Exam  Constitutional: He is oriented to person, place, and time. He appears well-developed and well-nourished. No distress.  HENT:  Head: Normocephalic and atraumatic.  Mallampati 3  Eyes: Pupils are equal, round, and reactive to light. Conjunctivae and EOM are normal. Right eye exhibits no discharge.  Neck: Normal range of motion. Neck supple. No tracheal deviation present. No thyromegaly present.  Cardiovascular: Normal rate, regular rhythm and normal heart sounds.  Pulmonary/Chest: Effort normal. No respiratory distress. He has no wheezes.  Abdominal: Soft. Bowel sounds are normal. He exhibits no distension. There is no tenderness.  Musculoskeletal: He  exhibits no edema or deformity.  Neurological: He is alert and oriented to person, place, and time. No cranial nerve deficit. Coordination normal.  Skin: Skin is warm and dry. He is not diaphoretic.   Data Reviewed: Records reviewed  Assessment:  .  History of significant snoring  .  History of witnessed apneas  .  Moderate probability of significant sleep disordered breathing  .  Allergic rhinitis  Plan/Recommendations: .  We will schedule you for a home sleep study  .  Pathophysiology of sleep disordered breathing discussed  .  Treatment options for sleep disordered breathing discussed  .  Restart on Singulair for allergic rhinitis, currently using Flonase with persistent symptoms  .  I will see you back in the office in about 6 to 8 weeks following initiation of treatment with CPAP if needed   Virl Diamond MD [facility] 02/01/2018, 8:55 AM  CC: Hannah Beat, MD

## 2018-02-01 NOTE — Patient Instructions (Addendum)
Moderate probability of sleep disordered breathing  Allergies/rhinitis-we will try treatment with Singulair  We will recommend a home sleep study  We will follow-up in 6 to 8 weeks following initiation of treatment if needed

## 2018-02-27 DIAGNOSIS — G4733 Obstructive sleep apnea (adult) (pediatric): Secondary | ICD-10-CM

## 2018-02-28 ENCOUNTER — Other Ambulatory Visit: Payer: Self-pay | Admitting: *Deleted

## 2018-02-28 ENCOUNTER — Encounter: Payer: Self-pay | Admitting: Family Medicine

## 2018-02-28 ENCOUNTER — Ambulatory Visit (INDEPENDENT_AMBULATORY_CARE_PROVIDER_SITE_OTHER)
Admission: RE | Admit: 2018-02-28 | Discharge: 2018-02-28 | Disposition: A | Discharge status: Home / Self Care | Source: Ambulatory Visit | Attending: Family Medicine | Admitting: Family Medicine

## 2018-02-28 ENCOUNTER — Ambulatory Visit (INDEPENDENT_AMBULATORY_CARE_PROVIDER_SITE_OTHER): Admitting: Family Medicine

## 2018-02-28 VITALS — BP 122/70 | HR 62 | Temp 98.0°F | Ht 69.0 in | Wt 193.2 lb

## 2018-02-28 DIAGNOSIS — M25532 Pain in left wrist: Secondary | ICD-10-CM | POA: Diagnosis not present

## 2018-02-28 DIAGNOSIS — R0683 Snoring: Secondary | ICD-10-CM

## 2018-02-28 DIAGNOSIS — G4733 Obstructive sleep apnea (adult) (pediatric): Secondary | ICD-10-CM

## 2018-02-28 MED ORDER — NAPROXEN 500 MG PO TABS: ORAL_TABLET | ORAL | 0 refills | Status: DC

## 2018-02-28 NOTE — Assessment & Plan Note (Addendum)
Point tenderness in h/o prior remote injury - update films. Anticipate tendonitis - discussed with patient. ddx includes gout or other inflammatory arthritis. Not consistent with infection. Rx Naprosyn 500mg  course, continue wrist brace, elevation, rest, ice. Update if not improving with treatment. Pt agrees with plan.

## 2018-02-28 NOTE — Patient Instructions (Signed)
xrays ok. I think you have wrist tendonitis. Continue wrist brace. Treat with naprosyn 500mg  twice daily with meals for 1 week, then as needed. Keep wrist elevated as able. May use ice (wrapped in towel) to wrist as tolerated.  Update if not improving with treatment. Pt agrees with plan.

## 2018-02-28 NOTE — Progress Notes (Signed)
BP 122/70 (BP Location: Right Arm, Patient Position: Sitting, Cuff Size: Normal)   Pulse 62   Temp 98 F (36.7 C) (Oral)   Ht 5\' 9"  (1.753 m)   Wt 193 lb 4 oz (87.7 kg)   SpO2 97%   BMI 28.54 kg/m    CC: L wrist pain Subjective:    Patient ID: Troy Sims, male    DOB: [birth year], 47 y.o.   MRN: [id number]  HPI: Troy Sims is a 47 y.o. male presenting on 02/28/2018 for Wrist Pain (C/o severe left wrist pain. Started last night and now has burning sensation. Pain occurs with flexion/extension. H/o wrist f/x yrs ago.)   R handed.  1d h/o/ L dorsal wrist pain - started suddenly last night after finishing cooking class (was rolling grape seeds).   No inciting trauma/injury, no recent bug bites.  No swelling, redness, mild warmth. No fevers.  No h/o gout. No paresthesias or numbness or burning pain.  H/o remote L wrist fracture.   Relevant past medical, surgical, family and social history reviewed and updated as indicated. Interim medical history since our last visit reviewed. Allergies and medications reviewed and updated. Outpatient Medications Prior to Visit  Medication Sig Dispense Refill  . montelukast (SINGULAIR) 10 MG tablet Take 1 tablet (10 mg total) by mouth at bedtime. 30 tablet 3   No facility-administered medications prior to visit.      Per HPI unless specifically indicated in ROS section below Review of Systems     Objective:    BP 122/70 (BP Location: Right Arm, Patient Position: Sitting, Cuff Size: Normal)   Pulse 62   Temp 98 F (36.7 C) (Oral)   Ht 5\' 9"  (1.753 m)   Wt 193 lb 4 oz (87.7 kg)   SpO2 97%   BMI 28.54 kg/m   Wt Readings from Last 3 Encounters:  02/28/18 193 lb 4 oz (87.7 kg)  02/01/18 195 lb 3.2 oz (88.5 kg)  01/16/18 192 lb 8 oz (87.3 kg)    Physical Exam  Constitutional: He appears well-developed and well-nourished. No distress.  Musculoskeletal: He exhibits no edema.  2+ rad pulses R hand WNL L hand - point tender to  palpation ventral mid wrist and ventral lateral wrist No pain at 1st [facility] or at scaphoid Limited ROM at wrist flexion/extension due to pain No significant edema, warmth or redness appreciated.   Neurological: He is alert. He has normal strength. No sensory deficit.  Neg tinel, neg phalen Grip strength intact  Skin: Skin is warm and dry. Capillary refill takes less than 2 seconds. No rash noted. No erythema.  Nursing note and vitals reviewed.     Assessment & Plan:   Problem List Items Addressed This Visit    Left wrist pain - Primary    Point tenderness in h/o prior remote injury - update films. Anticipate tendonitis - discussed with patient. ddx includes gout or other inflammatory arthritis. Not consistent with infection. Rx Naprosyn 500mg  course, continue wrist brace, elevation, rest, ice. Update if not improving with treatment. Pt agrees with plan.       Relevant Orders   DG Wrist Complete Left       Meds ordered this encounter  Medications  . naproxen (NAPROSYN) 500 MG tablet    Sig: Take one po bid x 1 week then prn pain, take with food    Dispense:  40 tablet    Refill:  0   Orders Placed This Encounter  Procedures  . DG Wrist Complete Left    Standing Status:   Future    Number of Occurrences:   1    Standing Expiration Date:   05/01/2019    Order Specific Question:   Reason for Exam (SYMPTOM  OR DIAGNOSIS REQUIRED)    Answer:   acute left ventral wrist pain    Order Specific Question:   Preferred imaging location?    Answer:   [facility]    Order Specific Question:   Radiology Contrast Protocol - do NOT remove file path    Answer:   [email or web address]    Follow up plan: Return if symptoms worsen or fail to improve.  Eustaquio Boyden, MD

## 2018-03-04 ENCOUNTER — Telehealth: Payer: Self-pay | Admitting: Pulmonary Disease

## 2018-03-04 DIAGNOSIS — G4733 Obstructive sleep apnea (adult) (pediatric): Secondary | ICD-10-CM

## 2018-03-04 NOTE — Telephone Encounter (Signed)
Dr. Wynona Neat has reviewed the home sleep test this showed Mild obstructive sleep apnea.   Recommendations   Treatment options are CPAP with the settings auto 5 to 15.    Weight loss measures .   Advise against driving while sleepy & against medication with sedative side effects.    Make appointment for 8 to 10 weeks for compliance with download with Dr. Wynona Neat.    Patient is aware and apt is made and order has been placed.

## 2018-05-28 ENCOUNTER — Ambulatory Visit: Admitting: Pulmonary Disease

## 2018-06-05 ENCOUNTER — Ambulatory Visit: Admitting: Pulmonary Disease

## 2018-06-10 ENCOUNTER — Ambulatory Visit (INDEPENDENT_AMBULATORY_CARE_PROVIDER_SITE_OTHER): Admitting: Pulmonary Disease

## 2018-06-10 ENCOUNTER — Encounter: Payer: Self-pay | Admitting: Pulmonary Disease

## 2018-06-10 VITALS — BP 138/80 | HR 72 | Ht 68.0 in | Wt 198.0 lb

## 2018-06-10 DIAGNOSIS — G4733 Obstructive sleep apnea (adult) (pediatric): Secondary | ICD-10-CM

## 2018-06-10 DIAGNOSIS — Z9989 Dependence on other enabling machines and devices: Secondary | ICD-10-CM

## 2018-06-10 NOTE — Progress Notes (Signed)
Troy RicherGregory O Keelin    [id number]    Feb 09, 1971  Primary Care Physician:Copland, Karleen HampshireSpencer, MD  Referring Physician: Hannah Beatopland, Spencer, MD [address] [place], [place] [id number]  Chief complaint:   Snoring Witnessed apneas  HPI: History of snoring-worsened lately Diagnosed with mild obstructive sleep apnea Has been on CPAP therapy Singulair seems to be helping nasal stuffiness and congestion Has not noticed significant symptom changes regarding sleep disordered breathing with CPAP use Currently uses a facemask-better seal  Goes to bed between 10 and 11 PM Takes him about 10 to 15 minutes to fall asleep Usually gets up about 7-8AM No significant weight change Usually very active  He has snored since childhood  Occupation: No pertinent occupational history Exposures: Significant exposures Smoking history: Non-smoker   Outpatient Encounter Medications as of 06/10/2018  Medication Sig  . montelukast (SINGULAIR) 10 MG tablet Take 1 tablet (10 mg total) by mouth at bedtime.  . naproxen (NAPROSYN) 500 MG tablet Take one po bid x 1 week then prn pain, take with food   No facility-administered encounter medications on file as of 06/10/2018.     Allergies as of 06/10/2018  . (No Known Allergies)    Past Medical History:  Diagnosis Date  . ACL (anterior cruciate ligament) rupture   . Allergic rhinitis due to pollen 09/26/2010  . Tibial fracture     Past Surgical History:  Procedure Laterality Date  . KNEE ARTHROSCOPY W/ ACL RECONSTRUCTION      Family History  Problem Relation Age of Onset  . Cancer Mother        breast    Social History   Socioeconomic History  . Marital status: Single    Spouse name: Not on file  . Number of children: Not on file  . Years of education: Not on file  . Highest education level: Not on file  Occupational History  . Occupation: truck Air traffic controllerdriver    Employer: [employer]  Social Needs  . Financial resource strain:  Not on file  . Food insecurity:    Worry: Not on file    Inability: Not on file  . Transportation needs:    Medical: Not on file    Non-medical: Not on file  Tobacco Use  . Smoking status: Never Smoker  . Smokeless tobacco: Never Used  Substance and Sexual Activity  . Alcohol use: No  . Drug use: No  . Sexual activity: Not on file  Lifestyle  . Physical activity:    Days per week: Not on file    Minutes per session: Not on file  . Stress: Not on file  Relationships  . Social connections:    Talks on phone: Not on file    Gets together: Not on file    Attends religious service: Not on file    Active member of club or organization: Not on file    Attends meetings of clubs or organizations: Not on file    Relationship status: Not on file  . Intimate partner violence:    Fear of current or ex partner: Not on file    Emotionally abused: Not on file    Physically abused: Not on file    Forced sexual activity: Not on file  Other Topics Concern  . 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   Respiratory: Positive for apnea.   Musculoskeletal: Negative.   Skin: Negative.   Psychiatric/Behavioral: Positive for sleep disturbance. Negative for decreased concentration and dysphoric mood.  All other systems reviewed and are negative.   Vitals:   06/10/18 1156  BP: 138/80  Pulse: 72  SpO2: 96%     Physical Exam  Constitutional: He appears well-developed and well-nourished. No distress.  HENT:  Head: Normocephalic and atraumatic.  Mallampati 3  Eyes: Pupils are equal, round, and reactive to light. Conjunctivae and EOM are normal. Right eye exhibits no discharge.  Neck: Normal range of motion. Neck supple. No tracheal deviation present. No thyromegaly present.  Cardiovascular: Normal rate, regular rhythm and normal heart  sounds.  Pulmonary/Chest: Effort normal. No respiratory distress. He has no wheezes.  Abdominal: Soft. Bowel sounds are normal.  Skin: Skin is warm and dry. He is not diaphoretic.   Results of the Epworth flowsheet 06/10/2018 02/01/2018  Sitting and reading 0 1  Watching TV 1 3  Sitting, inactive in a public place (e.g. a theatre or a meeting) 0 0  As a passenger in a car for an hour without a break 1 3  Lying down to rest in the afternoon when circumstances permit 0 0  Sitting and talking to someone 0 1  Sitting quietly after a lunch without alcohol 0 0  In a car, while stopped for a few minutes in traffic 0 0  Total score 2 8   Data Reviewed: Records reviewed  Assessment:  .  Mild obstructive sleep apnea   .  History of witnessed apneas  .  Allergic rhinitis   Plan/Recommendations: .  We will continue CPAP therapy at present  .  Encouraged to continue working on weight loss efforts  .  Once he reaches his ideal weight, if he still having significant problems tolerating CPAP study may always be repeated to ascertain whether he still has sleep apnea that requires treatment   .  Restart on Singulair for allergic rhinitis, currently using Flonase with persistent symptoms  .  I will see you back in the office in about 6 months  Virl Diamond MD  Pulmonary and Critical Care 06/10/2018, 12:08 PM  CC: Hannah Beat, MD

## 2018-06-10 NOTE — Patient Instructions (Signed)
Obstructive sleep apnea  Recommend continuing CPAP at present We will see you back in about 6 months Call with any significant concerns

## 2018-07-08 ENCOUNTER — Other Ambulatory Visit: Payer: Self-pay | Admitting: Pulmonary Disease

## 2018-07-08 ENCOUNTER — Telehealth: Payer: Self-pay | Admitting: Pulmonary Disease

## 2018-07-08 MED ORDER — MONTELUKAST SODIUM 10 MG PO TABS: 10.0000 mg | ORAL_TABLET | Freq: Every day | ORAL | 5 refills | Status: DC

## 2018-07-08 NOTE — Telephone Encounter (Signed)
Spoke with pt. He is needing a refill on Montelukast. Rx has been sent in. Nothing further was needed.

## 2019-01-20 ENCOUNTER — Telehealth: Payer: Self-pay | Admitting: Family Medicine

## 2019-01-20 NOTE — Telephone Encounter (Signed)
He is past due for his CPE.  He needs to schedule that with fasting labs prior and we can give him his Tdap at that visit.

## 2019-01-20 NOTE — Telephone Encounter (Signed)
Pt put in an appointment request via MyChart for a tetanus shot. Is this okay to schedule?

## 2019-01-22 ENCOUNTER — Encounter: Payer: Self-pay | Admitting: Family Medicine

## 2019-01-22 ENCOUNTER — Other Ambulatory Visit: Payer: Self-pay

## 2019-01-22 ENCOUNTER — Ambulatory Visit (INDEPENDENT_AMBULATORY_CARE_PROVIDER_SITE_OTHER): Admitting: Family Medicine

## 2019-01-22 VITALS — BP 120/80 | HR 82 | Temp 98.3°F | Ht 68.0 in | Wt 196.8 lb

## 2019-01-22 DIAGNOSIS — Z131 Encounter for screening for diabetes mellitus: Secondary | ICD-10-CM

## 2019-01-22 DIAGNOSIS — Z0000 Encounter for general adult medical examination without abnormal findings: Secondary | ICD-10-CM | POA: Diagnosis not present

## 2019-01-22 DIAGNOSIS — Z23 Encounter for immunization: Secondary | ICD-10-CM

## 2019-01-22 LAB — HEPATIC FUNCTION PANEL
ALT: 41 U/L (ref 0–53)
AST: 27 U/L (ref 0–37)
Albumin: 4.9 g/dL (ref 3.5–5.2)
Alkaline Phosphatase: 76 U/L (ref 39–117)
Bilirubin, Direct: 0.1 mg/dL (ref 0.0–0.3)
Total Bilirubin: 0.6 mg/dL (ref 0.2–1.2)
Total Protein: 7.2 g/dL (ref 6.0–8.3)

## 2019-01-22 LAB — CBC WITH DIFFERENTIAL/PLATELET
Basophils Absolute: 0 10*3/uL (ref 0.0–0.1)
Basophils Relative: 0.7 % (ref 0.0–3.0)
Eosinophils Absolute: 0.3 10*3/uL (ref 0.0–0.7)
Eosinophils Relative: 5.3 % — ABNORMAL HIGH (ref 0.0–5.0)
HCT: 46.3 % (ref 39.0–52.0)
Hemoglobin: 15.9 g/dL (ref 13.0–17.0)
Lymphocytes Relative: 33.8 % (ref 12.0–46.0)
Lymphs Abs: 2.2 10*3/uL (ref 0.7–4.0)
MCHC: 34.4 g/dL (ref 30.0–36.0)
MCV: 90.4 fl (ref 78.0–100.0)
Monocytes Absolute: 0.5 10*3/uL (ref 0.1–1.0)
Monocytes Relative: 7.1 % (ref 3.0–12.0)
Neutro Abs: 3.4 10*3/uL (ref 1.4–7.7)
Neutrophils Relative %: 53.1 % (ref 43.0–77.0)
Platelets: 318 10*3/uL (ref 150.0–400.0)
RBC: 5.11 Mil/uL (ref 4.22–5.81)
RDW: 12.3 % (ref 11.5–15.5)
WBC: 6.4 10*3/uL (ref 4.0–10.5)

## 2019-01-22 LAB — BASIC METABOLIC PANEL
BUN: 12 mg/dL (ref 6–23)
CO2: 30 mEq/L (ref 19–32)
Calcium: 10.1 mg/dL (ref 8.4–10.5)
Chloride: 103 mEq/L (ref 96–112)
Creatinine, Ser: 0.93 mg/dL (ref 0.40–1.50)
GFR: 87.09 mL/min (ref >60.00)
Glucose, Bld: 92 mg/dL (ref 70–99)
Potassium: 4.7 mEq/L (ref 3.5–5.1)
Sodium: 140 mEq/L (ref 135–145)

## 2019-01-22 LAB — LIPID PANEL
Cholesterol: 210 mg/dL — ABNORMAL HIGH (ref 0–200)
HDL: 43.3 mg/dL (ref >39.00)
LDL Cholesterol: 143 mg/dL — ABNORMAL HIGH (ref 0–99)
NonHDL: 166.56
Total CHOL/HDL Ratio: 5
Triglycerides: 119 mg/dL (ref 0.0–149.0)
VLDL: 23.8 mg/dL (ref 0.0–40.0)

## 2019-01-22 LAB — HEMOGLOBIN A1C: Hgb A1c MFr Bld: 5.5 % (ref 4.6–6.5)

## 2019-01-22 NOTE — Progress Notes (Signed)
Troy Sims T. Troy Polivka, MD Primary Care and [place] at [facility] [place], [address] Phone: [phone]  FAX: [phone]  Troy Sims - 48 y.o. male  MRN [id number]  Date of Birth: [birth year]  Visit Date: 01/22/2019  PCP: Troy Loffler, MD  Referred by: Troy Loffler, MD  Chief Complaint  Patient presents with  . Annual Exam   Patient Care Team: Troy Loffler, MD as PCP - General Subjective:   Troy Sims is a 48 y.o. pleasant patient who presents with the following:  Preventative Health Maintenance Visit:  Health Maintenance Summary Reviewed and updated, unless pt declines services.  Tobacco History Reviewed. Alcohol: No concerns, no excessive use Exercise Habits: hour mixed 4 time a week STD concerns: no risk or activity to increase risk Drug Use: None Encouraged self-testicular check  CHECK ALL LABS  Health Maintenance  Topic Date Due  . HIV Screening  01/23/1987  . TETANUS/TDAP  05/29/2014  . INFLUENZA VACCINE  12/28/2018   Immunization History  Administered Date(s) Administered  . Influenza Split 05/08/2012  . Influenza,inj,Quad PF,6+ Mos 03/14/2017  . Influenza-Unspecified 02/19/2018  . MMR 02/21/2017  . Td 05/29/2004   Patient Active Problem List   Diagnosis Date Noted  . Left wrist pain 02/28/2018  . Chronic pain of left knee 03/21/2017  . Allergic rhinitis due to pollen 09/26/2010   Past Medical History:  Diagnosis Date  . ACL (anterior cruciate ligament) rupture   . Allergic rhinitis due to pollen 09/26/2010  . Tibial fracture    Past Surgical History:  Procedure Laterality Date  . KNEE ARTHROSCOPY W/ ACL RECONSTRUCTION     Social History   Socioeconomic History  . Marital status: Single    Spouse name: Not on file  . Number of children: Not on file  . Years of education: Not on file  . Highest education level: Not on file  Occupational History  .  Occupation: truck Education administrator: [employer]  . Financial resource strain: Not on file  . Food insecurity    Worry: Not on file    Inability: Not on file  . Transportation needs    Medical: Not on file    Non-medical: Not on file  Tobacco Use  . Smoking status: Never Smoker  . Smokeless tobacco: Never Used  Substance and Sexual Activity  . Alcohol use: No  . Drug use: No  . Sexual activity: Not on file  Lifestyle  . Physical activity    Days per week: Not on file    Minutes per session: Not on file  . Stress: Not on file  Relationships  . Social Herbalist on phone: Not on file    Gets together: Not on file    Attends religious service: Not on file    Active member of club or organization: Not on file    Attends meetings of clubs or organizations: Not on file    Relationship status: Not on file  . Intimate partner violence    Fear of current or ex partner: Not on file    Emotionally abused: Not on file    Physically abused: Not on file    Forced sexual activity: Not on file  Other Topics Concern  . 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 Cancer Mother        breast   No Known Allergies  Medication list has been reviewed and updated.   General: Denies fever, chills, sweats. No significant weight loss. Eyes: Denies blurring,significant itching ENT: Denies earache, sore throat, and hoarseness. Cardiovascular: Denies chest pains, palpitations, dyspnea on exertion Respiratory: Denies cough, dyspnea at rest,wheeezing Breast: no concerns about lumps GI: Denies nausea, vomiting, diarrhea, constipation, change in bowel habits, abdominal pain, melena, hematochezia GU: Denies penile discharge, ED, urinary flow / outflow problems. No STD concerns. Musculoskeletal: Denies back  pain, joint pain Derm: Denies rash, itching Neuro: Denies  paresthesias, frequent falls, frequent headaches Psych: Denies depression, anxiety Endocrine: Denies cold intolerance, heat intolerance, polydipsia Heme: Denies enlarged lymph nodes Allergy: No hayfever  Objective:   BP 120/80   Pulse 82   Temp 98.3 F (36.8 C) (Temporal)   Ht _0  (1.727 m)   Wt 196 lb 12 oz (89.2 kg)   SpO2 98%   BMI 29.92 kg/m  Ideal Body Weight: Weight in (lb) to have BMI = 25: 164.1  Ideal Body Weight: Weight in (lb) to have BMI = 25: 164.1 No exam data present Depression screen [facility] 2/9 01/22/2019  Decreased Interest 0  Down, Depressed, Hopeless 0  PHQ - 2 Score 0     GEN: well developed, well nourished, no acute distress Eyes: conjunctiva and lids normal, PERRLA, EOMI ENT: TM clear, nares clear, oral exam WNL Neck: supple, no lymphadenopathy, no thyromegaly, no JVD Pulm: clear to auscultation and percussion, respiratory effort normal CV: regular rate and rhythm, S1-S2, no murmur, rub or gallop, no bruits, peripheral pulses normal and symmetric, no cyanosis, clubbing, edema or varicosities GI: soft, non-tender; no hepatosplenomegaly, masses; active bowel sounds all quadrants GU: no hernia, testicular mass, penile discharge Lymph: no cervical, axillary or inguinal adenopathy MSK: gait normal, muscle tone and strength WNL, no joint swelling, effusions, discoloration, crepitus  SKIN: clear, good turgor, color WNL, no rashes, lesions, or ulcerations Neuro: normal mental status, normal strength, sensation, and motion Psych: alert; oriented to person, place and time, normally interactive and not anxious or depressed in appearance. All labs reviewed with patient.  Assessment and Plan:     ICD-10-CM   1. Healthcare maintenance  Z00.00    Tdap today  Health Maintenance Exam: The patient's preventative maintenance and recommended screening tests for an annual wellness exam were reviewed in full  today. Brought up to date unless services declined.  Counselled on the importance of diet, exercise, and its role in overall health and mortality. The patient's FH and SH was reviewed, including their home life, tobacco status, and drug and alcohol status.  Follow-up in 1 year for physical exam or additional follow-up below.  Follow-up: No follow-ups on file. Or follow-up in 1 year if not noted.  No orders of the defined types were placed in this encounter.  Medications Discontinued During This Encounter  Medication Reason  . naproxen (NAPROSYN) 500 MG tablet Completed Course  . montelukast (SINGULAIR) 10 MG tablet Duplicate   No orders of the defined types were placed in this encounter.   Signed,  Maud Deed. Gabreille Dardis, MD   Allergies as of 01/22/2019   No Known Allergies     Medication List       Accurate as of January 22, 2019 10:44 AM. If you have any  questions, ask your nurse or doctor.        STOP taking these medications   naproxen 500 MG tablet Commonly known as: Naprosyn Stopped by: Troy Loffler, MD     TAKE these medications   montelukast 10 MG tablet Commonly known as: SINGULAIR Take 1 tablet (10 mg total) by mouth at bedtime.

## 2019-01-22 NOTE — Addendum Note (Signed)
Addended by: Carter Kitten on: 01/22/2019 11:09 AM   Modules accepted: Orders

## 2019-01-23 LAB — PSA, TOTAL WITH REFLEX TO PSA, FREE: PSA, Total: 0.4 ng/mL (ref <=4.0)

## 2019-02-28 ENCOUNTER — Other Ambulatory Visit: Payer: Self-pay | Admitting: Pulmonary Disease

## 2019-04-16 ENCOUNTER — Other Ambulatory Visit: Payer: Self-pay | Admitting: Family Medicine

## 2019-04-16 ENCOUNTER — Telehealth: Payer: Self-pay | Admitting: Radiology

## 2019-04-16 DIAGNOSIS — Z205 Contact with and (suspected) exposure to viral hepatitis: Secondary | ICD-10-CM

## 2019-04-16 NOTE — Telephone Encounter (Signed)
Please schedule a lab appt, non fasting.

## 2019-04-16 NOTE — Progress Notes (Signed)
Hep b  Terri, can you help set up for hep b blood draw?

## 2019-04-16 NOTE — Progress Notes (Signed)
lvm for patient to call and schedule an appt for lab work

## 2019-04-17 ENCOUNTER — Other Ambulatory Visit

## 2019-04-17 ENCOUNTER — Other Ambulatory Visit: Payer: Self-pay

## 2019-04-17 ENCOUNTER — Other Ambulatory Visit (INDEPENDENT_AMBULATORY_CARE_PROVIDER_SITE_OTHER)

## 2019-04-17 DIAGNOSIS — Z205 Contact with and (suspected) exposure to viral hepatitis: Secondary | ICD-10-CM | POA: Diagnosis not present

## 2019-04-18 LAB — HEPATITIS B CORE ANTIBODY, IGM: Hep B C IgM: NONREACTIVE

## 2019-04-18 LAB — HEPATITIS B SURFACE ANTIGEN: Hepatitis B Surface Ag: NONREACTIVE

## 2019-04-18 LAB — HEPATITIS B SURFACE ANTIBODY,QUALITATIVE: Hep B S Ab: BORDERLINE — AB

## 2019-04-21 ENCOUNTER — Encounter: Payer: Self-pay | Admitting: Family Medicine

## 2019-09-23 ENCOUNTER — Ambulatory Visit: Admitting: Dermatology

## 2019-11-24 ENCOUNTER — Other Ambulatory Visit: Payer: Self-pay

## 2019-11-24 ENCOUNTER — Encounter: Payer: Self-pay | Admitting: Family Medicine

## 2019-11-24 ENCOUNTER — Ambulatory Visit (INDEPENDENT_AMBULATORY_CARE_PROVIDER_SITE_OTHER): Admitting: Family Medicine

## 2019-11-24 VITALS — BP 120/80 | HR 78 | Temp 98.1°F | Ht 68.0 in | Wt 199.0 lb

## 2019-11-24 DIAGNOSIS — K5909 Other constipation: Secondary | ICD-10-CM | POA: Diagnosis not present

## 2019-11-24 NOTE — Patient Instructions (Signed)
GETTING TO GOOD BOWEL HEALTH. Irregular bowel habits such as constipation and diarrhea can lead to many problems over time.  Having one soft bowel movement a day is the most important way to prevent further problems.  The anorectal canal is designed to handle stretching and feces to safely manage our ability to get rid of solid waste (feces, poop, stool) out of our body.  BUT, hard constipated stools can act like ripping concrete bricks and diarrhea can be a burning fire to this very sensitive area of our body, causing inflamed hemorrhoids, anal fissures, increasing risk is perirectal abscesses, abdominal pain/bloating, an making irritable bowel worse.     The goal: ONE SOFT BOWEL MOVEMENT A DAY!  To have soft, regular bowel movements:  . Drink at least 8 tall glasses of water a day.   . Take plenty of fiber.  Fiber is the undigested part of plant food that passes into the colon, acting s "natures broom" to encourage bowel motility and movement.  Fiber can absorb and hold large amounts of water. This results in a larger, bulkier stool, which is soft and easier to pass. Work gradually over several weeks up to 6 servings a day of fiber (25g a day even more if needed) in the form of: o Vegetables -- Root (potatoes, carrots, turnips), leafy green (lettuce, salad greens, celery, spinach), or cooked high residue (cabbage, broccoli, etc) o Fruit -- Fresh (unpeeled skin & pulp), Dried (prunes, apricots, cherries, etc ),  or stewed ( applesauce)  o Whole grain breads, pasta, etc (whole wheat)  o Bran cereals  . Bulking Agents -- This type of water-retaining fiber generally is easily obtained each day by one of the following:  o Psyllium bran -- The psyllium plant is remarkable because its ground seeds can retain so much water. This product is available as Metamucil, Konsyl, Effersyllium, Per Diem Fiber, or the less expensive generic preparation in drug and health food stores. Although labeled a laxative, it really  is not a laxative.  o Methylcellulose -- This is another fiber derived from wood which also retains water. It is available as Citrucel. o Polyethylene Glycol - and "artificial" fiber commonly called Miralax or Glycolax.  It is helpful for people with gassy or bloated feelings with regular fiber o Flax Seed - a less gassy fiber than psyllium . No reading or other relaxing activity while on the toilet. If bowel movements take longer than 5 minutes, you are too constipated . AVOID CONSTIPATION.  High fiber and water intake usually takes care of this.  Sometimes a laxative is needed to stimulate more frequent bowel movements, but  . Laxatives are not a good long-term solution as it can wear the colon out. o Osmotics (Milk of Magnesia, Fleets phosphosoda, Magnesium citrate, MiraLax, GoLytely) are safer than  o Stimulants (Senokot, Castor Oil, Dulcolax, Ex Lax)    o Do not take laxatives for more than 7days in a row. .  IF SEVERELY CONSTIPATED, try a Bowel Retraining Program: o Do not use laxatives.  o Eat a diet high in roughage, such as bran cereals and leafy vegetables.  o Drink six (6) ounces of prune or apricot juice each morning.  o Eat two (2) large servings of stewed fruit each day.  o Take one (1) heaping tablespoon of a psyllium-based bulking agent twice a day. Use sugar-free sweetener when possible to avoid excessive calories.  o Eat a normal breakfast.  o Set aside 15 minutes after breakfast to   sit on the toilet, but do not strain to have a bowel movement.  o If you do not have a bowel movement by the third day, use an enema and repeat the above steps.  . Controlling diarrhea o Switch to liquids and simpler foods for a few days to avoid stressing your intestines further. o Avoid dairy products (especially milk & ice cream) for a short time.  The intestines often can lose the ability to digest lactose when stressed. o Avoid foods that cause gassiness or bloating.  Typical foods include  beans and other legumes, cabbage, broccoli, and dairy foods.  Every person has some sensitivity to other foods, so listen to our body and avoid those foods that trigger problems for you. o Adding fiber (Citrucel, Metamucil, psyllium, Miralax) gradually can help thicken stools by absorbing excess fluid and retrain the intestines to act more normally.  Slowly increase the dose over a few weeks.  Too much fiber too soon can backfire and cause cramping & bloating. o Probiotics (such as active yogurt, Align, etc) may help repopulate the intestines and colon with normal bacteria and calm down a sensitive digestive tract.  Most studies show it to be of mild help, though, and such products can be costly. o Medicines: - Bismuth subsalicylate (ex. Kayopectate, Pepto Bismol) every 30 minutes for up to 6 doses can help control diarrhea.  Avoid if pregnant. - Loperamide (Immodium) can slow down diarrhea.  Start with two tablets (4mg total) first and then try one tablet every 6 hours.  Avoid if you are having fevers or severe pain.  If you are not better or start feeling worse, stop all medicines and call your doctor for advice -   

## 2019-11-24 NOTE — Progress Notes (Signed)
Troy Bromwell T. Malek Skog, MD, CAQ Sports Medicine  Primary Care and Sports Medicine [facility] at [facility] [address] [place], [address]  Phone: [phone]  FAX: [phone]  [name] - 49 y.o. male  MRN [id number]  Date of Birth: 09-10-1970  Date: 11/24/2019  PCP: Hannah Beat, MD  Referral: Hannah Beat, MD  Chief Complaint  Patient presents with  . digestive issues    changes in bowel movements    This visit occurred during the SARS-CoV-2 public health emergency.  Safety protocols were in place, including screening questions prior to the visit, additional usage of staff PPE, and extensive cleaning of exam room while observing appropriate contact time as indicated for disinfecting solutions.   Subjective:   Troy Sims is a 49 y.o. very pleasant male patient with Body mass index is 30.26 kg/m. who presents with the following:  Cannot get regular with what he can ever do.  Nothing has been helping Regular, then maybe another time.  He will be regular at all, now he is having some bowel movements approximately every other day and they are hard and ball like. Sometimes will get small ball in the stool  Exercise does not help.  Drinking some kombucha Eating some high fiber does not really help Every few months, then eat a full bag of prunes and then will have a clean-out Lots of fiber He does not drink enough water.  Constipation  Review of Systems is noted in the HPI, as appropriate  Objective:   BP 120/80   Pulse 78   Temp 98.1 F (36.7 C) (Skin)   Ht 5\' 8"  (1.727 m)   Wt 199 lb (90.3 kg)   SpO2 96%   BMI 30.26 kg/m   GEN: No acute distress; alert,appropriate. PULM: Breathing comfortably in no respiratory distress PSYCH: Normally interactive.  ABD: S, mild left lower quadrant tenderness , ND, + BS, No rebound, No HSM   Laboratory and Imaging Data:  Assessment and Plan:     ICD-10-CM   1. Other  constipation  K59.09    Reviewed basic constipation care  Patient Instructions  GETTING TO GOOD BOWEL HEALTH. Irregular bowel habits such as constipation and diarrhea can lead to many problems over time.  Having one soft bowel movement a day is the most important way to prevent further problems.  The anorectal canal is designed to handle stretching and feces to safely manage our ability to get rid of solid waste (feces, poop, stool) out of our body.  BUT, hard constipated stools can act like ripping concrete bricks and diarrhea can be a burning fire to this very sensitive area of our body, causing inflamed hemorrhoids, anal fissures, increasing risk is perirectal abscesses, abdominal pain/bloating, an making irritable bowel worse.     The goal: ONE SOFT BOWEL MOVEMENT A DAY!  To have soft, regular bowel movements:  Drink at least 8 tall glasses of water a day.   Take plenty of fiber.  Fiber is the undigested part of plant food that passes into the colon, acting s "natures broom" to encourage bowel motility and movement.  Fiber can absorb and hold large amounts of water. This results in a larger, bulkier stool, which is soft and easier to pass. Work gradually over several weeks up to 6 servings a day of fiber (25g a day even more if needed) in the form of: Vegetables -- Root (potatoes, carrots, turnips), leafy green (lettuce, salad greens, celery,  spinach), or cooked high residue (cabbage, broccoli, etc) Fruit -- Fresh (unpeeled skin & pulp), Dried (prunes, apricots, cherries, etc ),  or stewed ( applesauce)  Whole grain breads, pasta, etc (whole wheat)  Bran cereals  Bulking Agents -- This type of water-retaining fiber generally is easily obtained each day by one of the following:  Psyllium bran -- The psyllium plant is remarkable because its ground seeds can retain so much water. This product is available as Metamucil, Konsyl, Effersyllium, Per Diem Fiber, or the less expensive generic preparation in  drug and health food stores. Although labeled a laxative, it really is not a laxative.  Methylcellulose -- This is another fiber derived from wood which also retains water. It is available as Citrucel. Polyethylene Glycol - and "artificial" fiber commonly called Miralax or Glycolax.  It is helpful for people with gassy or bloated feelings with regular fiber Flax Seed - a less gassy fiber than psyllium No reading or other relaxing activity while on the toilet. If bowel movements take longer than 5 minutes, you are too constipated AVOID CONSTIPATION.  High fiber and water intake usually takes care of this.  Sometimes a laxative is needed to stimulate more frequent bowel movements, but  Laxatives are not a good long-term solution as it can wear the colon out. Osmotics (Milk of Magnesia, Fleets phosphosoda, Magnesium citrate, MiraLax, GoLytely) are safer than  Stimulants (Senokot, Castor Oil, Dulcolax, Ex Lax)    Do not take laxatives for more than 7days in a row.  IF SEVERELY CONSTIPATED, try a Bowel Retraining Program: Do not use laxatives.  Eat a diet high in roughage, such as bran cereals and leafy vegetables.  Drink six (6) ounces of prune or apricot juice each morning.  Eat two (2) large servings of stewed fruit each day.  Take one (1) heaping tablespoon of a psyllium-based bulking agent twice a day. Use sugar-free sweetener when possible to avoid excessive calories.  Eat a normal breakfast.  Set aside 15 minutes after breakfast to sit on the toilet, but do not strain to have a bowel movement.  If you do not have a bowel movement by the third day, use an enema and repeat the above steps.  Controlling diarrhea Switch to liquids and simpler foods for a few days to avoid stressing your intestines further. Avoid dairy products (especially milk & ice cream) for a short time.  The intestines often can lose the ability to digest lactose when stressed. Avoid foods that cause gassiness or bloating.   Typical foods include beans and other legumes, cabbage, broccoli, and dairy foods.  Every person has some sensitivity to other foods, so listen to our body and avoid those foods that trigger problems for you. Adding fiber (Citrucel, Metamucil, psyllium, Miralax) gradually can help thicken stools by absorbing excess fluid and retrain the intestines to act more normally.  Slowly increase the dose over a few weeks.  Too much fiber too soon can backfire and cause cramping & bloating. Probiotics (such as active yogurt, Align, etc) may help repopulate the intestines and colon with normal bacteria and calm down a sensitive digestive tract.  Most studies show it to be of mild help, though, and such products can be costly. Medicines: Bismuth subsalicylate (ex. Kayopectate, Pepto Bismol) every 30 minutes for up to 6 doses can help control diarrhea.  Avoid if pregnant. Loperamide (Immodium) can slow down diarrhea.  Start with two tablets (4mg  total) first and then try one tablet every 6 hours.  Avoid if you are having fevers or severe pain.  If you are not better or start feeling worse, stop all medicines and call your doctor for advice      Follow-up: No follow-ups on file.  No orders of the defined types were placed in this encounter.  There are no discontinued medications. No orders of the defined types were placed in this encounter.   Signed,  Elpidio Galea. Akbar Sacra, MD   Outpatient Encounter Medications as of 11/24/2019  Medication Sig  . montelukast (SINGULAIR) 10 MG tablet TAKE 1 TABLET BY MOUTH AT BEDTIME.   No facility-administered encounter medications on file as of 11/24/2019.

## 2019-12-08 ENCOUNTER — Ambulatory Visit: Admitting: Dermatology

## 2020-02-11 ENCOUNTER — Encounter (INDEPENDENT_AMBULATORY_CARE_PROVIDER_SITE_OTHER): Admitting: Family Medicine

## 2020-02-11 ENCOUNTER — Encounter: Payer: Self-pay | Admitting: Family Medicine

## 2020-02-11 ENCOUNTER — Other Ambulatory Visit: Payer: Self-pay

## 2020-02-11 VITALS — BP 120/72 | HR 69 | Temp 98.4°F | Ht 68.0 in | Wt 196.5 lb

## 2020-02-11 DIAGNOSIS — Z23 Encounter for immunization: Secondary | ICD-10-CM

## 2020-02-11 NOTE — Progress Notes (Signed)
This encounter was created in error - please disregard.

## 2020-02-16 ENCOUNTER — Encounter: Payer: Self-pay | Admitting: Family Medicine

## 2020-02-16 ENCOUNTER — Other Ambulatory Visit: Payer: Self-pay

## 2020-02-16 ENCOUNTER — Ambulatory Visit (INDEPENDENT_AMBULATORY_CARE_PROVIDER_SITE_OTHER): Admitting: Family Medicine

## 2020-02-16 VITALS — BP 112/80 | HR 70 | Temp 98.2°F | Ht 68.0 in | Wt 195.0 lb

## 2020-02-16 DIAGNOSIS — G4761 Periodic limb movement disorder: Secondary | ICD-10-CM | POA: Diagnosis not present

## 2020-02-16 LAB — CBC WITH DIFFERENTIAL/PLATELET
Basophils Absolute: 0 10*3/uL (ref 0.0–0.1)
Basophils Relative: 0.6 % (ref 0.0–3.0)
Eosinophils Absolute: 0.2 10*3/uL (ref 0.0–0.7)
Eosinophils Relative: 3.7 % (ref 0.0–5.0)
HCT: 47.3 % (ref 39.0–52.0)
Hemoglobin: 16.4 g/dL (ref 13.0–17.0)
Lymphocytes Relative: 34.9 % (ref 12.0–46.0)
Lymphs Abs: 2.3 10*3/uL (ref 0.7–4.0)
MCHC: 34.7 g/dL (ref 30.0–36.0)
MCV: 90.3 fl (ref 78.0–100.0)
Monocytes Absolute: 0.6 10*3/uL (ref 0.1–1.0)
Monocytes Relative: 8.4 % (ref 3.0–12.0)
Neutro Abs: 3.5 10*3/uL (ref 1.4–7.7)
Neutrophils Relative %: 52.4 % (ref 43.0–77.0)
Platelets: 338 10*3/uL (ref 150.0–400.0)
RBC: 5.23 Mil/uL (ref 4.22–5.81)
RDW: 12.6 % (ref 11.5–15.5)
WBC: 6.6 10*3/uL (ref 4.0–10.5)

## 2020-02-16 LAB — IBC + FERRITIN
Ferritin: 123.7 ng/mL (ref 22.0–322.0)
Iron: 102 ug/dL (ref 42–165)
Saturation Ratios: 30.1 % (ref 20.0–50.0)
Transferrin: 242 mg/dL (ref 212.0–360.0)

## 2020-02-16 MED ORDER — ROPINIROLE HCL 0.25 MG PO TABS: ORAL_TABLET | ORAL | 1 refills | Status: DC

## 2020-02-16 NOTE — Progress Notes (Signed)
    Troy Sims T. [name], MD, CAQ Sports Medicine  Primary Care and Sports Medicine [facility] at [facility] [address] [place], [address]  Phone: [phone]  FAX: [phone]  FRISCO CORDTS - 49 y.o. male  MRN [id number]  Date of Birth: [birth year]  Date: 02/16/2020  PCP: Hannah Beat, MD  Referral: Hannah Beat, MD  Chief Complaint  Patient presents with  . Knee Pain    wants to discuss if possible restless leg    This visit occurred during the SARS-CoV-2 public health emergency.  Safety protocols were in place, including screening questions prior to the visit, additional usage of staff PPE, and extensive cleaning of exam room while observing appropriate contact time as indicated for disinfecting solutions.   Subjective:   Troy Sims is a 49 y.o. very pleasant male patient with Body mass index is 29.65 kg/m. who presents with the following:  [name] is here to discuss some leg and knee pain.  Check iron  Night and cannot seem to feel comfortable. Could be restless leg syndrome.  No numbness or tingling.   ? After knee injury  She notices it about every day. Alcohol did nelp Now on his cpap.  He is using his CPAP routinely after been diagnosed with sleep apnea.  Still is tired during the afternoon and after lunch.  He does not eat a large lunch and is sleeping about 7 to 8 hours right now.  His wife does notice that he moves around his sleep quite a bit and often wakes her up at nighttime.  Review of Systems is noted in the HPI, as appropriate   Objective:   BP 112/80   Pulse 70   Temp 98.2 F (36.8 C) (Temporal)   Ht 5\' 8"  (1.727 m)   Wt 195 lb (88.5 kg)   SpO2 95%   BMI 29.65 kg/m    GEN: No acute distress; alert,appropriate. PULM: Breathing comfortably in no respiratory distress PSYCH: Normally interactive.      Radiology: No results found.  Assessment and Plan:     ICD-10-CM   1. 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  Iron deficiency or iron deficiency anemia cannot be excluded.  Can also contribute to the symptoms.  Other neurological process could be possible.  Follow-up: Return if symptoms worsen or fail to improve.  Meds ordered this encounter  Medications  . rOPINIRole (REQUIP) 0.25 MG tablet    Sig: Take 1 tablet for 3 days at night, then increase to 2 tablets at night    Dispense:  60 tablet    Refill:  1   There are no discontinued medications. Orders Placed This Encounter  Procedures  . CBC with Differential/Platelet  . IBC + Ferritin    Signed,  Troy Red T. Zella Dewan, MD   Outpatient Encounter Medications as of 02/16/2020  Medication Sig  . montelukast (SINGULAIR) 10 MG tablet TAKE 1 TABLET BY MOUTH AT BEDTIME.  02/18/2020 rOPINIRole (REQUIP) 0.25 MG tablet Take 1 tablet for 3 days at night, then increase to 2 tablets at night   No facility-administered encounter medications on file as of 02/16/2020.

## 2020-02-18 ENCOUNTER — Other Ambulatory Visit

## 2020-02-18 DIAGNOSIS — Z20822 Contact with and (suspected) exposure to covid-19: Secondary | ICD-10-CM

## 2020-02-20 LAB — SARS-COV-2, NAA 2 DAY TAT

## 2020-02-20 LAB — NOVEL CORONAVIRUS, NAA: SARS-CoV-2, NAA: NOT DETECTED

## 2020-03-26 ENCOUNTER — Other Ambulatory Visit: Payer: Self-pay | Admitting: Pulmonary Disease

## 2020-03-29 ENCOUNTER — Other Ambulatory Visit: Payer: Self-pay | Admitting: Family Medicine

## 2020-03-29 NOTE — Telephone Encounter (Signed)
Last office visit 02/16/2020 for restless legs.  Last CPE 01/22/2019.  No future appointments.  Ok to refill?

## 2020-06-30 IMAGING — DX DG WRIST COMPLETE 3+V*L*
4 series · 4 of 4 positions shown · non-contrast
Comparison: None.

CLINICAL DATA: Acute left wrist pain.

EXAM:
LEFT WRIST - COMPLETE 3+ VIEW

[wrist ap]
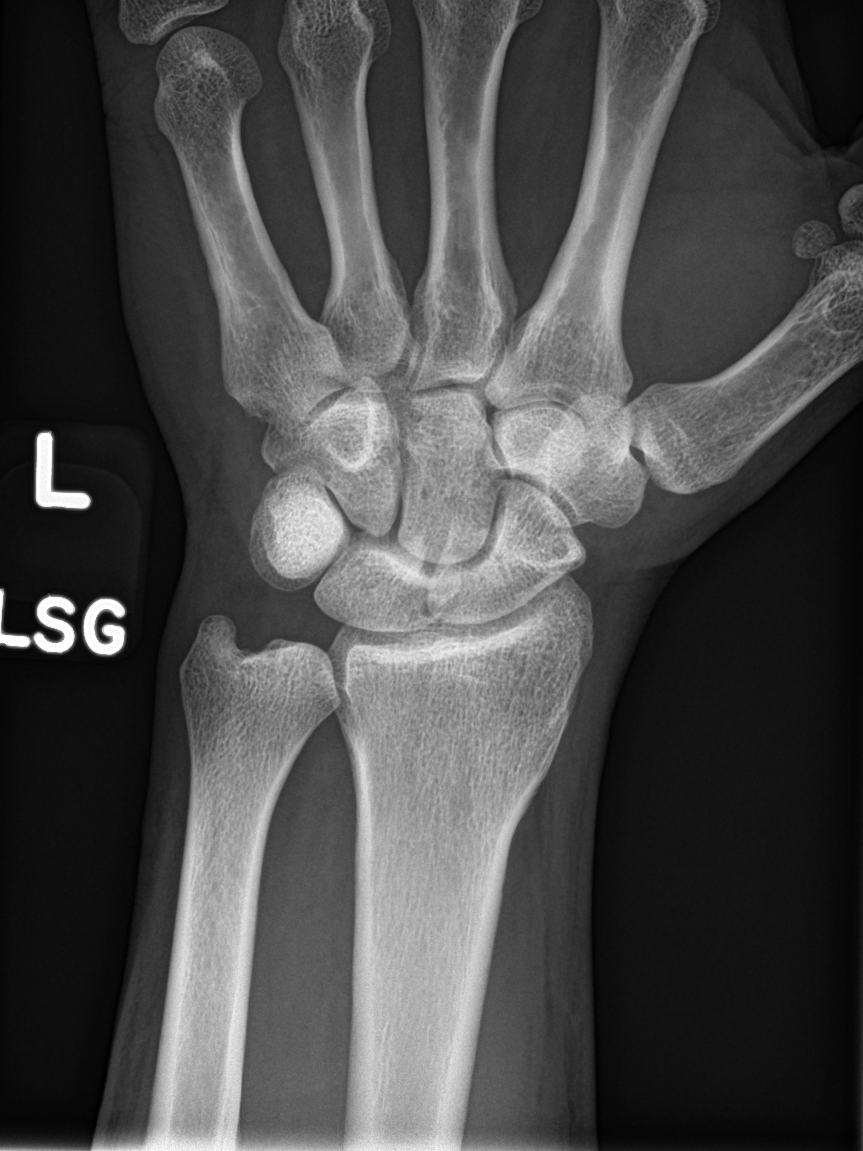

[wrist obl]
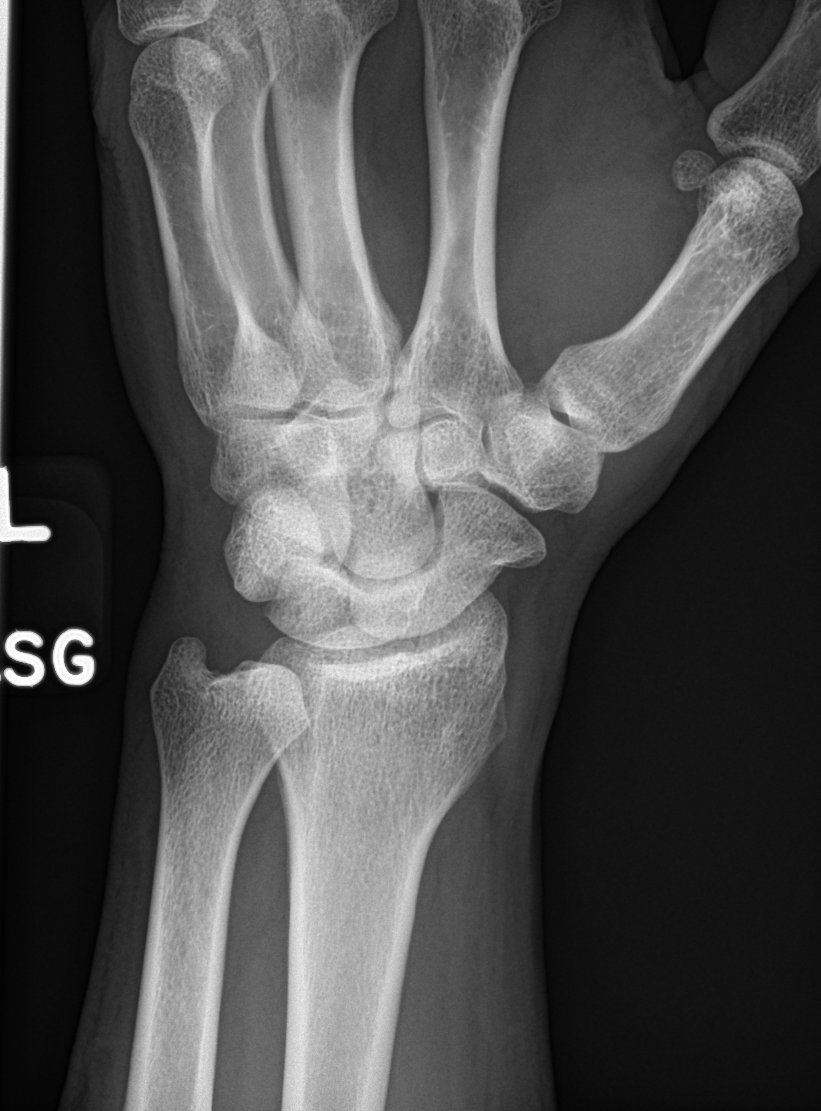

[wrist lat]
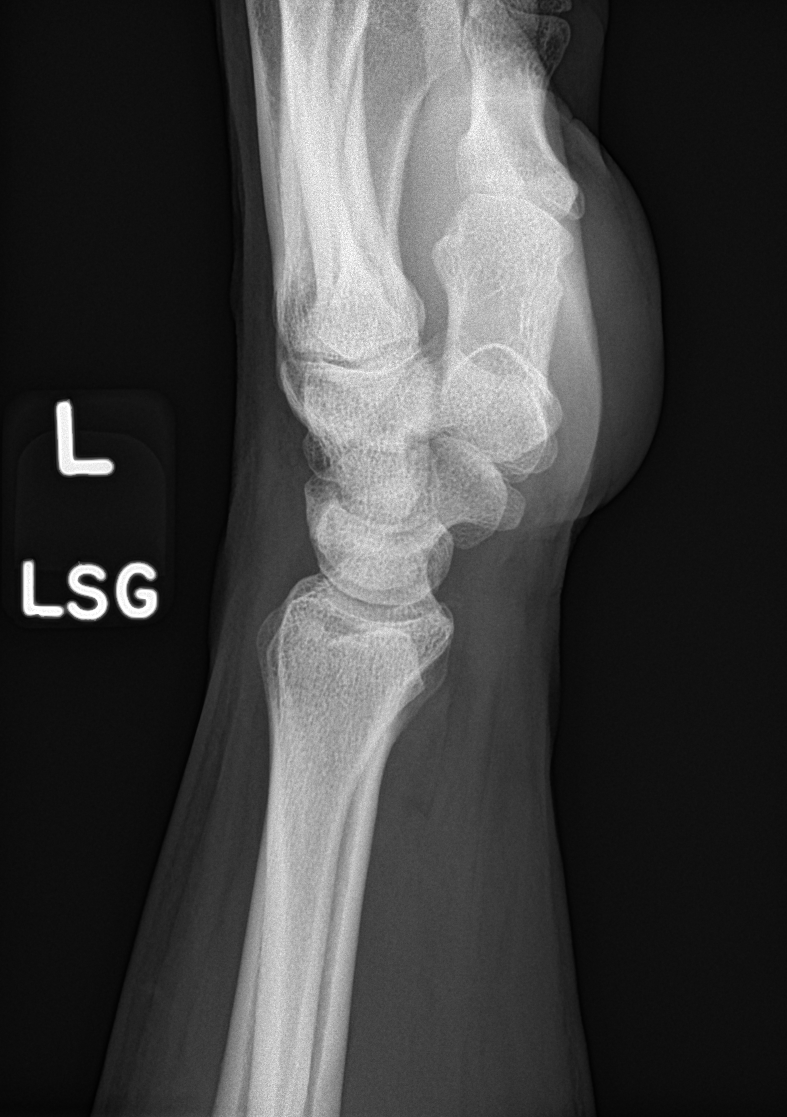

[wrist pa]
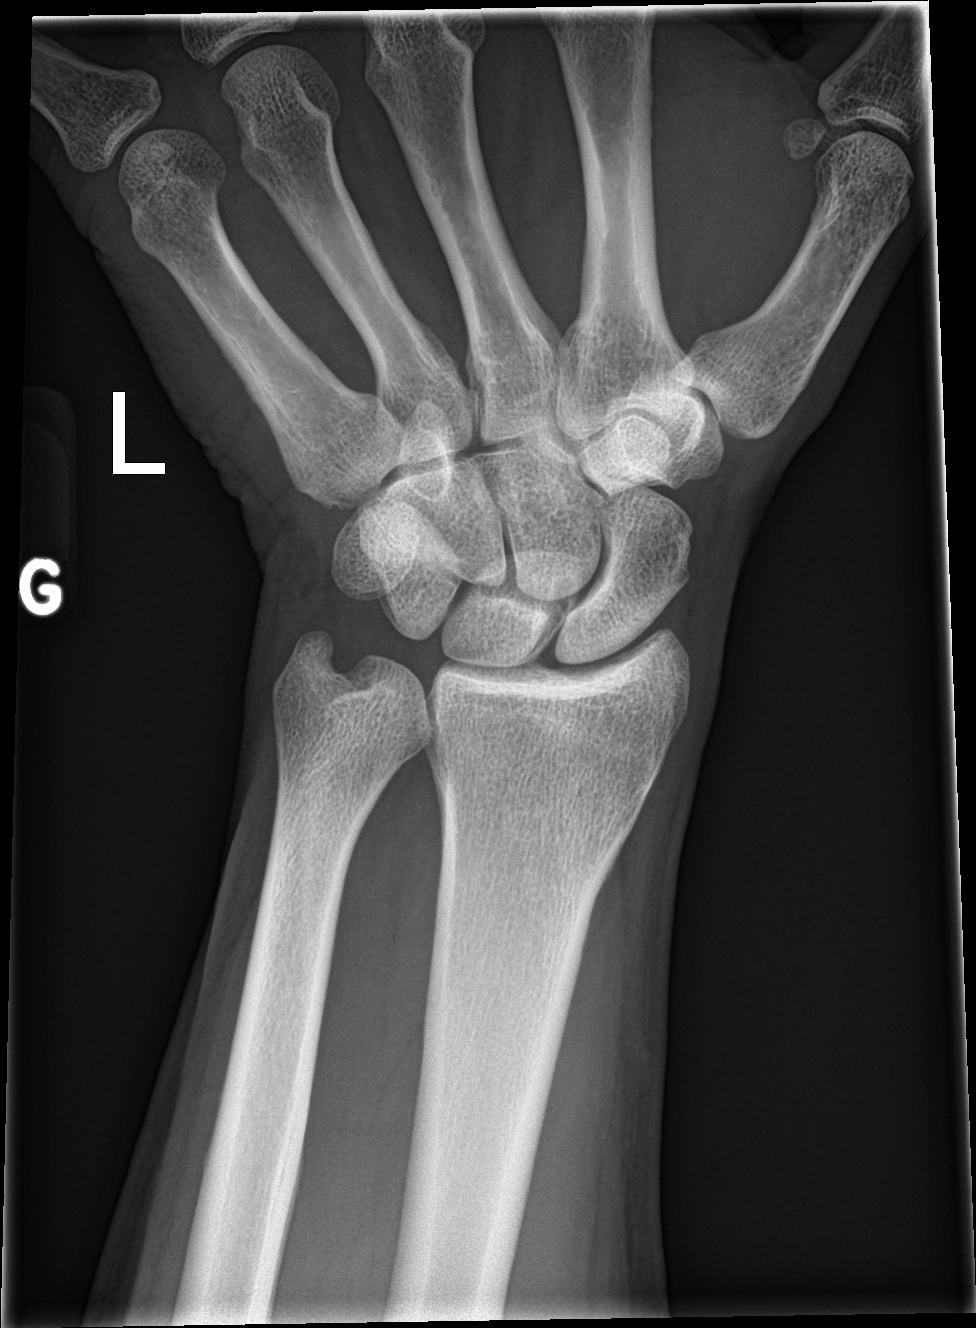

[4 of 4 positions shown; findings below may reference images not displayed]

FINDINGS: There is no evidence of fracture or dislocation. There is no
evidence of arthropathy or other focal bone abnormality. Soft
tissues are unremarkable.
IMPRESSION: Negative.

## 2020-11-17 ENCOUNTER — Ambulatory Visit: Admission: EM | Admit: 2020-11-17 | Discharge: 2020-11-17 | Disposition: A | Discharge status: Home / Self Care

## 2020-11-17 ENCOUNTER — Other Ambulatory Visit: Payer: Self-pay

## 2020-11-17 ENCOUNTER — Encounter: Payer: Self-pay | Admitting: *Deleted

## 2020-11-17 DIAGNOSIS — S61211A Laceration without foreign body of left index finger without damage to nail, initial encounter: Secondary | ICD-10-CM | POA: Diagnosis not present

## 2020-11-17 MED ORDER — HIBICLENS 4 % EX LIQD: Freq: Every day | CUTANEOUS | 0 refills | Status: DC | PRN

## 2020-11-17 NOTE — ED Provider Notes (Signed)
[facility]    CSN: [id number] Arrival date & time: 11/17/20  1705      History   Chief Complaint Chief Complaint  Patient presents with   Extremity Laceration    HPI Troy Sims is a 50 y.o. male.   Patient presenting today with a laceration to left index finger that he sustained this evening while using a table saw.  He states he was removing his hand from the area of the saw when the second blade hit his finger.  States the blade was clean at the time and he has not noted any foreign body to the wound.  Denies numbness, tingling, decreased range of motion, uncontrolled bleeding, swelling to the site.  No damage to the nail.  Has used soap and water to clean it and has been keeping it wrapped in soft gauze.  Last Tdap was 2020.   Past Medical History:  Diagnosis Date   ACL (anterior cruciate ligament) rupture    Allergic rhinitis due to pollen 09/26/2010   Tibial fracture     Patient Active Problem List   Diagnosis Date Noted   Allergic rhinitis due to pollen 09/26/2010    Past Surgical History:  Procedure Laterality Date   KNEE ARTHROSCOPY W/ ACL RECONSTRUCTION         Home Medications    Prior to Admission medications   Medication Sig Start Date End Date Taking? Authorizing Provider  Cetirizine HCl (ZYRTEC PO) Take by mouth.   Yes [provider]  chlorhexidine (HIBICLENS) 4 % external liquid Apply topically daily as needed. 11/17/20  Yes Particia Nearing, PA-C  montelukast (SINGULAIR) 10 MG tablet TAKE 1 TABLET BY MOUTH AT BEDTIME. 02/28/19   Olalere, Adewale A, MD  rOPINIRole (REQUIP) 0.25 MG tablet Take 2 tablets (0.5 mg total) by mouth at bedtime. 03/29/20   CoplandKarleen Hampshire, MD    Family History Family History  Problem Relation Age of Onset   Cancer Mother        breast   Healthy Father     Social History Social History   Tobacco Use   Smoking status: Never   Smokeless tobacco: Never  Vaping Use   Vaping Use: Never  used  Substance Use Topics   Alcohol use: Yes    Comment: one per week   Drug use: No     Allergies   Patient has no known allergies.   Review of Systems Review of Systems Per HPI  Physical Exam Triage Vital Signs ED Triage Vitals  Enc Vitals Group     BP 11/17/20 1906 (!) 128/93     Pulse Rate 11/17/20 1906 81     Resp 11/17/20 1906 18     Temp 11/17/20 1906 98.3 F (36.8 C)     Temp Source 11/17/20 1906 Oral     SpO2 11/17/20 1906 95 %     Weight --      Height --      Head Circumference --      Peak Flow --      Pain Score 11/17/20 1904 1     Pain Loc --      Pain Edu? --      Excl. in GC? --    No data found.  Updated Vital Signs BP (!) 128/93 (BP Location: Right Arm)   Pulse 81   Temp 98.3 F (36.8 C) (Oral)   Resp 18   SpO2 95%   Visual Acuity Right Eye  Distance:   Left Eye Distance:   Bilateral Distance:    Right Eye Near:   Left Eye Near:    Bilateral Near:     Physical Exam Vitals and nursing note reviewed.  Constitutional:      Appearance: Normal appearance.  HENT:     Head: Atraumatic.  Eyes:     Extraocular Movements: Extraocular movements intact.     Conjunctiva/sclera: Conjunctivae normal.  Cardiovascular:     Rate and Rhythm: Normal rate and regular rhythm.  Pulmonary:     Effort: Pulmonary effort is normal.     Breath sounds: Normal breath sounds.  Musculoskeletal:        General: Tenderness and signs of injury present. Normal range of motion.     Cervical back: Normal range of motion and neck supple.  Skin:    General: Skin is warm and dry.     Comments: 1-1.25 cm poorly approximated laceration across the DIP of the left index finger.  Bleeding well controlled, edges beginning to scab over.  Good range of motion intact in finger.  No damage to the nail, no foreign body noted  Neurological:     General: No focal deficit present.     Mental Status: He is oriented to person, place, and time.     Comments: Left hand  neurovascularly intact  Psychiatric:        Mood and Affect: Mood normal.        Thought Content: Thought content normal.        Judgment: Judgment normal.   UC Treatments / Results  Labs (all labs ordered are listed, but only abnormal results are displayed) Labs Reviewed - No data to display  EKG   Radiology No results found.  Procedures Laceration Repair  Date/Time: 11/17/2020 7:30 PM Performed by: Particia Nearing, PA-C Authorized by: Particia Nearing, PA-C   Consent:    Consent obtained:  Verbal   Consent given by:  Patient   Risks, benefits, and alternatives were discussed: yes     Risks discussed:  Infection, pain and poor cosmetic result   Alternatives discussed: Suture. Universal protocol:    Procedure explained and questions answered to patient or proxy's satisfaction: yes     Relevant documents present and verified: yes     Immediately prior to procedure, a time out was called: yes     Patient identity confirmed:  Verbally with patient and arm band Anesthesia:    Anesthesia method:  None Laceration details:    Location:  Finger   Finger location:  L index finger   Length (cm):  1.3   Depth (mm):  3 Pre-procedure details:    Preparation:  Patient was prepped and draped in usual sterile fashion Exploration:    Limited defect created (wound extended): no     Hemostasis achieved with:  Direct pressure   Imaging outcome: foreign body not noted     Wound exploration: wound explored through full range of motion and entire depth of wound visualized     Contaminated: no   Treatment:    Area cleansed with:  Chlorhexidine   Amount of cleaning:  Standard   Irrigation method:  Pressure wash   Visualized foreign bodies/material removed: no     Debridement:  None   Undermining:  None   Scar revision: no   Skin repair:    Repair method:  Tissue adhesive Approximation:    Approximation:  Loose Repair type:    Repair type:  Simple Post-procedure  details:    Dressing:  Splint for protection and non-adherent dressing   Procedure completion:  Tolerated well, no immediate complications (including critical care time)  Medications Ordered in UC Medications - No data to display  Initial Impression / Assessment and Plan / UC Course  I have reviewed the triage vital signs and the nursing notes.  Pertinent labs & imaging results that were available during my care of the patient were reviewed by me and considered in my medical decision making (see chart for details).     Wound cleaned with Hibiclens and Dermabond applied along with dressing and finger splint.  Extensive wound care instructions given and return precautions.  Up-to-date on Tdap.  Aware of what to look for for infection.  Follow-up for worsening course.  Over-the-counter pain relievers as needed.  Final Clinical Impressions(s) / UC Diagnoses   Final diagnoses:  Laceration of left index finger without foreign body without damage to nail, initial encounter   Discharge Instructions   None    ED Prescriptions     Medication Sig Dispense Auth. Provider   chlorhexidine (HIBICLENS) 4 % external liquid Apply topically daily as needed. 120 mL Particia Nearing, [place]      PDMP not reviewed this encounter.   Particia Nearing, [place] 11/17/20 1934

## 2020-11-17 NOTE — ED Triage Notes (Signed)
Pt reports sustaining laceration to left index finger from a saw approx 2.5 hrs ago.  LUE index finger CMS intact.  Laceration noted to left posterior index finger; no active bleeding; distal finger pink, warm, with prompt cap refill.

## 2021-04-25 ENCOUNTER — Ambulatory Visit (INDEPENDENT_AMBULATORY_CARE_PROVIDER_SITE_OTHER): Admitting: Physician Assistant

## 2021-04-25 ENCOUNTER — Encounter: Payer: Self-pay | Admitting: Physician Assistant

## 2021-04-25 VITALS — BP 120/84 | HR 84 | Ht 68.0 in | Wt 197.0 lb

## 2021-04-25 DIAGNOSIS — Z1211 Encounter for screening for malignant neoplasm of colon: Secondary | ICD-10-CM | POA: Diagnosis not present

## 2021-04-25 DIAGNOSIS — K5909 Other constipation: Secondary | ICD-10-CM | POA: Diagnosis not present

## 2021-04-25 MED ORDER — PEG-KCL-NACL-NASULF-NA ASC-C 100 G PO SOLR: 1.0000 | Freq: Once | ORAL | 0 refills | Status: AC

## 2021-04-25 NOTE — Progress Notes (Signed)
Agree with the assessment and plan as outlined by Jennifer Lemmon, PA-C. ? ?Shannie Kontos E. Mackensie Pilson, MD ? ?

## 2021-04-25 NOTE — Progress Notes (Signed)
Chief Complaint: Constipation  HPI:    Troy Sims is a 50 year old male with a past medical history as listed below, who was referred to me by Hannah Beat, MD for a complaint of constipation.    Today, the patient presents to clinic and tells me that over the past 6 months he has had a change towards constipation telling me that he may not have a bowel movement for 3 days or so.  He had actually noticed a change about 2 years ago when he started drinking a lot of Kambucha which seemed to help for a while, but then this really stopped working and he started taking MiraLAX as needed which does seem to help him but he just uses it as needed.  He can still sometimes go 3 days without a bowel movement.  When he is constipated he has a generalized abdominal discomfort and fatigue.    Also discusses an increase in belching over the past year with very occasional reflux about once a week for which she takes Rolaids or Tums.  Typically related to what he eats.    He works for the [employer] and is getting ready to retire next year.    Denies fever, chills, weight loss, blood in his stool or family history of colon cancer.   Past Medical History:  Diagnosis Date   ACL (anterior cruciate ligament) rupture    Allergic rhinitis due to pollen 09/26/2010   Tibial fracture     Past Surgical History:  Procedure Laterality Date   KNEE ARTHROSCOPY W/ ACL RECONSTRUCTION      Current Outpatient Medications  Medication Sig Dispense Refill   Cetirizine HCl (ZYRTEC PO) Take by mouth.     chlorhexidine (HIBICLENS) 4 % external liquid Apply topically daily as needed. 120 mL 0   montelukast (SINGULAIR) 10 MG tablet TAKE 1 TABLET BY MOUTH AT BEDTIME. 30 tablet 1   rOPINIRole (REQUIP) 0.25 MG tablet Take 2 tablets (0.5 mg total) by mouth at bedtime. 60 tablet 5   No current facility-administered medications for this visit.    Allergies as of 04/25/2021   (No Known Allergies)    Family History  Problem  Relation Age of Onset   Cancer Mother        breast   Healthy Father     Social History   Socioeconomic History   Marital status: Single    Spouse name: Not on file   Number of children: Not on file   Years of education: Not on file   Highest education level: Not on file  Occupational History   Occupation: truck driver    Employer: [employer]  Tobacco Use   Smoking status: Never   Smokeless tobacco: Never  Vaping Use   Vaping Use: Never used  Substance and Sexual Activity   Alcohol use: Yes    Comment: one per week   Drug use: No   Sexual activity: Not on file  Other Topics Concern   Not on file  Social History Narrative   Separated from wife Troy Sims   2 childern logan and taylor      Reservist in the [employer]         Social Determinants of Health   Financial Resource Strain: Not on file  Food Insecurity: Not on file  Transportation Needs: Not on file  Physical Activity: Not on file  Stress: Not on file  Social Connections: Not on file  Intimate Partner Violence: Not on file  Review of Systems:    Constitutional: No weight loss, fever or chills Skin: No rash  Cardiovascular: No chest pain  Respiratory: No SOB Gastrointestinal: See HPI and otherwise negative Genitourinary: No dysuria  Neurological: No headache, dizziness or syncope Musculoskeletal: No new muscle or joint pain Hematologic: No bleeding Psychiatric: No history of depression or anxiety   Physical Exam:  Vital signs: BP 120/84 (BP Location: Left Arm, Patient Position: Sitting, Cuff Size: Normal)   Pulse 84   Ht 5\' 8"  (1.727 m) Comment: height measured without shoes  Wt 197 lb (89.4 kg)   BMI 29.95 kg/m    Constitutional:   Pleasant Caucasian male appears to be in NAD, Well developed, Well nourished, alert and cooperative Head:  Normocephalic and atraumatic. Eyes:   PEERL, EOMI. No icterus. Conjunctiva pink. Ears:  Normal auditory acuity. Neck:  Supple Throat:  Oral cavity and pharynx without inflammation, swelling or lesion.  Respiratory: Respirations even and unlabored. Lungs clear to auscultation bilaterally.   No wheezes, crackles, or rhonchi.  Cardiovascular: Normal S1, S2. No MRG. Regular rate and rhythm. No peripheral edema, cyanosis or pallor.  Gastrointestinal:  Soft, nondistended, mild left lower quadrant TTP no rebound or guarding. Normal bowel sounds. No appreciable masses or hepatomegaly. Rectal:  Not performed.  Msk:  Symmetrical without gross deformities. Without edema, no deformity or joint abnormality.  Neurologic:  Alert and  oriented x4;  grossly normal neurologically.  Skin:   Dry and intact without significant lesions or rashes. Psychiatric:  Demonstrates good judgement and reason without abnormal affect or behaviors.  RELEVANT LABS AND IMAGING: CBC    Component Value Date/Time   WBC 6.6 02/16/2020 1142   RBC 5.23 02/16/2020 1142   HGB 16.4 02/16/2020 1142   HCT 47.3 02/16/2020 1142   PLT 338.0 02/16/2020 1142   MCV 90.3 02/16/2020 1142   MCHC 34.7 02/16/2020 1142   RDW 12.6 02/16/2020 1142   LYMPHSABS 2.3 02/16/2020 1142   MONOABS 0.6 02/16/2020 1142   EOSABS 0.2 02/16/2020 1142   BASOSABS 0.0 02/16/2020 1142    CMP     Component Value Date/Time   NA 140 01/22/2019 1105   K 4.7 01/22/2019 1105   CL 103 01/22/2019 1105   CO2 30 01/22/2019 1105   GLUCOSE 92 01/22/2019 1105   BUN 12 01/22/2019 1105   CREATININE 0.93 01/22/2019 1105   CALCIUM 10.1 01/22/2019 1105   PROT 7.2 01/22/2019 1105   ALBUMIN 4.9 01/22/2019 1105   AST 27 01/22/2019 1105   ALT 41 01/22/2019 1105   ALKPHOS 76 01/22/2019 1105   BILITOT 0.6 01/22/2019 1105    Assessment: 1.  Chronic constipation: Over the past 2 years, worse over the past 6 months, some better with MiraLAX; likely slow transit 2.  Screening for colorectal cancer: Patient is 52 never had screening for colorectal cancer  Plan: 1.  Scheduled patient for screening  colonoscopy in the [facility] with Dr. 54.  Did provide the patient a detailed list of risks for the procedure and he agrees to proceed.  Patient will continue to follow with Dr. Tomasa Rand after time of procedures as his primary GI physician. 2.  Patient will have a 2-day bowel prep given history of constipation 3.  Discussed taking MiraLAX daily titrated to effect. 4.  Patient to follow in clinic per recommendations from Dr. Tomasa Rand after time of procedure.  Tomasa Rand, PA-C [facility] 04/25/2021, 3:28 PM  Cc: 04/27/2021, MD

## 2021-04-25 NOTE — Patient Instructions (Signed)
Start Miralax 1 capful daily in 8 ounces of liquid.  You have been scheduled for a colonoscopy. Please follow written instructions given to you at your visit today.  Please pick up your prep supplies at the pharmacy within the next 1-3 days. If you use inhalers (even only as needed), please bring them with you on the day of your procedure.  If you are age 50 or older, your body mass index should be between 23-30. Your Body mass index is 29.95 kg/m. If this is out of the aforementioned range listed, please consider follow up with your Primary Care Provider.  If you are age 33 or younger, your body mass index should be between 19-25. Your Body mass index is 29.95 kg/m. If this is out of the aformentioned range listed, please consider follow up with your Primary Care Provider.   ________________________________________________________  The [facility] providers would like to encourage you to use [facility] to communicate with providers for non-urgent requests or questions.  Due to long hold times on the telephone, sending your provider a message by [facility] may be a faster and more efficient way to get a response.  Please allow 48 business hours for a response.  Please remember that this is for non-urgent requests.  _______________________________________________________

## 2021-05-02 ENCOUNTER — Ambulatory Visit (AMBULATORY_SURGERY_CENTER): Admitting: Gastroenterology

## 2021-05-02 ENCOUNTER — Other Ambulatory Visit: Payer: Self-pay

## 2021-05-02 ENCOUNTER — Encounter: Payer: Self-pay | Admitting: Gastroenterology

## 2021-05-02 VITALS — BP 118/79 | HR 70 | Temp 98.2°F | Resp 11 | Ht 68.0 in | Wt 197.0 lb

## 2021-05-02 DIAGNOSIS — Z1211 Encounter for screening for malignant neoplasm of colon: Secondary | ICD-10-CM | POA: Diagnosis not present

## 2021-05-02 MED ORDER — SODIUM CHLORIDE 0.9 % IV SOLN: 500.0000 mL | Freq: Once | INTRAVENOUS | Status: DC

## 2021-05-02 NOTE — Progress Notes (Signed)
To pacu, VSS. Report to [email or web address] 

## 2021-05-02 NOTE — Op Note (Signed)
[facility] Patient Name: Troy Sims Procedure Date: 05/02/2021 11:12 AM MRN: [id number] Endoscopist: Lorin Picket E. Tomasa Rand , MD Age: 50 Referring MD:  Date of Birth: July 07, 1970 Gender: Male Account #: [id number] Procedure:                Colonoscopy Indications:              Screening for colorectal malignant neoplasm, This                            is the patient's first colonoscopy Medicines:                Monitored Anesthesia Care Procedure:                Pre-Anesthesia Assessment:                           - Prior to the procedure, a History and Physical                            was performed, and patient medications and                            allergies were reviewed. The patient's tolerance of                            previous anesthesia was also reviewed. The risks                            and benefits of the procedure and the sedation                            options and risks were discussed with the patient.                            All questions were answered, and informed consent                            was obtained. Prior Anticoagulants: The patient has                            taken no previous anticoagulant or antiplatelet                            agents. ASA Grade Assessment: II - A patient with                            mild systemic disease. After reviewing the risks                            and benefits, the patient was deemed in                            satisfactory condition to undergo the procedure.  After obtaining informed consent, the colonoscope                            was passed under direct vision. Throughout the                            procedure, the patient's blood pressure, pulse, and                            oxygen saturations were monitored continuously. The                            CF [id number] #[id number] was introduced through the anus                            and advanced to the  the terminal ileum, with                            identification of the appendiceal orifice and IC                            valve. The colonoscopy was performed without                            difficulty. The patient tolerated the procedure                            well. The quality of the bowel preparation was                            good. The terminal ileum, ileocecal valve,                            appendiceal orifice, and rectum were photographed.                            The bowel preparation used was MoviPrep via split                            dose instruction. Scope In: 11:27:41 AM Scope Out: 11:37:43 AM Scope Withdrawal Time: 0 hours 8 minutes 50 seconds  Total Procedure Duration: 0 hours 10 minutes 2 seconds  Findings:                 The perianal and digital rectal examinations were                            normal. Pertinent negatives include normal                            sphincter tone and no palpable rectal lesions.                           The colon (entire examined portion) appeared normal.  The terminal ileum appeared normal.                           The retroflexed view of the distal rectum and anal                            verge was normal and showed no anal or rectal                            abnormalities. Complications:            No immediate complications. Estimated Blood Loss:     Estimated blood loss: none. Impression:               - The entire examined colon is normal.                           - The examined portion of the ileum was normal.                           - The distal rectum and anal verge are normal on                            retroflexion view.                           - No specimens collected. Recommendation:           - Patient has a contact number available for                            emergencies. The signs and symptoms of potential                            delayed complications were  discussed with the                            patient. Return to normal activities tomorrow.                            Written discharge instructions were provided to the                            patient.                           - Resume previous diet.                           - Continue present medications.                           - Repeat colonoscopy in 10 years for screening                            purposes. Ifeoluwa Bartz E. Tomasa Rand, MD 05/02/2021 11:41:18 AM This report has been signed electronically.

## 2021-05-02 NOTE — Progress Notes (Signed)
VS taken by DT 

## 2021-05-02 NOTE — Patient Instructions (Signed)
Resume previous diet and medications. ° °Repeat colonoscopy in 10 years. ° ° °YOU HAD AN ENDOSCOPIC PROCEDURE TODAY AT THE [facility]:   Refer to the procedure report that was given to you for any specific questions about what was found during the examination.  If the procedure report does not answer your questions, please call your gastroenterologist to clarify.  If you requested that your care partner not be given the details of your procedure findings, then the procedure report has been included in a sealed envelope for you to review at your convenience later. ° °YOU SHOULD EXPECT: Some feelings of bloating in the abdomen. Passage of more gas than usual.  Walking can help get rid of the air that was put into your GI tract during the procedure and reduce the bloating. If you had a lower endoscopy (such as a colonoscopy or flexible sigmoidoscopy) you may notice spotting of blood in your stool or on the toilet paper. If you underwent a bowel prep for your procedure, you may not have a normal bowel movement for a few days. ° °Please Note:  You might notice some irritation and congestion in your nose or some drainage.  This is from the oxygen used during your procedure.  There is no need for concern and it should clear up in a day or so. ° °SYMPTOMS TO REPORT IMMEDIATELY: ° °Following lower endoscopy (colonoscopy or flexible sigmoidoscopy): ° Excessive amounts of blood in the stool ° Significant tenderness or worsening of abdominal pains ° Swelling of the abdomen that is new, acute ° Fever of 100°F or higher ° ° °For urgent or emergent issues, a gastroenterologist can be reached at any hour by calling [phone]. °Do not use MyChart messaging for urgent concerns.  ° ° °DIET:  We do recommend a small meal at first, but then you may proceed to your regular diet.  Drink plenty of fluids but you should avoid alcoholic beverages for 24 hours. ° °ACTIVITY:  You should plan to take it easy for the rest of  today and you should NOT DRIVE or use heavy machinery until tomorrow (because of the sedation medicines used during the test).   ° °FOLLOW UP: °Our staff will call the number listed on your records 48-72 hours following your procedure to check on you and address any questions or concerns that you may have regarding the information given to you following your procedure. If we do not reach you, we will leave a message.  We will attempt to reach you two times.  During this call, we will ask if you have developed any symptoms of COVID 19. If you develop any symptoms (ie: fever, flu-like symptoms, shortness of breath, cough etc.) before then, please call [phone].  If you test positive for Covid 19 in the 2 weeks post procedure, please call and report this information to us.   ° °If any biopsies were taken you will be contacted by phone or by letter within the next 1-3 weeks.  Please call us at [phone] if you have not heard about the biopsies in 3 weeks.  ° ° °SIGNATURES/CONFIDENTIALITY: °You and/or your care partner have signed paperwork which will be entered into your electronic medical record.  These signatures attest to the fact that that the information above on your After Visit Summary has been reviewed and is understood.  Full responsibility of the confidentiality of this discharge information lies with you and/or your care-partner.  °

## 2021-05-02 NOTE — Progress Notes (Signed)
History and Physical Interval Note:  05/02/2021 11:23 AM  Troy Sims  has presented today for endoscopic procedure(s), with the diagnosis of  Encounter Diagnosis  Name Primary?   Screening for colon cancer Yes  .  The various methods of evaluation and treatment have been discussed with the patient and/or family. After consideration of risks, benefits and other options for treatment, the patient has consented to  the endoscopic procedure(s).   The patient's history has been reviewed, patient examined, no change in status, stable for endoscopic procedure(s).  I have reviewed the patient's chart and labs.  Questions were answered to the patient's satisfaction.     Adrean Heitz E. Tomasa Rand, MD [facility]

## 2021-05-04 ENCOUNTER — Telehealth: Payer: Self-pay

## 2021-05-04 NOTE — Telephone Encounter (Signed)
  Follow up Call-  Call back number 05/02/2021  Post procedure Call Back phone  # [phone]  Permission to leave phone message Yes  Some recent data might be hidden     Patient questions:  Do you have a fever, pain , or abdominal swelling? No. Pain Score  0 *  Have you tolerated food without any problems? Yes.    Have you been able to return to your normal activities? Yes.    Do you have any questions about your discharge instructions: Diet   No. Medications  No. Follow up visit  No.  Do you have questions or concerns about your Care? No.  Actions: * If pain score is 4 or above: No action needed, pain <4.

## 2021-12-02 ENCOUNTER — Telehealth: Payer: Self-pay

## 2021-12-02 NOTE — Telephone Encounter (Signed)
Left message for [name] that his vaccine record is ready to be picked up at the front desk.

## 2021-12-02 NOTE — Telephone Encounter (Signed)
Patient is calling in asking for a copy of all immunizations for work purposes. Asked for call once ready for pick up.

## 2022-02-21 ENCOUNTER — Telehealth: Payer: Self-pay | Admitting: Family Medicine

## 2022-02-21 DIAGNOSIS — R519 Headache, unspecified: Secondary | ICD-10-CM

## 2022-02-21 NOTE — Telephone Encounter (Signed)
Can you call Marya Amsler and get more information.  First, terrible news about the melanoma.  So sorry to hear it.  Neurology?  I need a bit more information and reason for the consult.  Migraines?

## 2022-02-21 NOTE — Telephone Encounter (Signed)
I spoke wth pt wife (DPR signed) she said today pt was scared with H/A of at least a pain level of 9. Pt exerted himself on 02/17/22 and had pain from left back side of neck that radiated to lt eyebrow. And pt had a blank stare on face with no facial expression at all. Pt was also disorientated for few seconds. This occurred again on 02/18/22.  Pts wife said they went to [facility] 02/21/22 at 11:30 and was seen. Mrs Want request cb after reviewed by Dr Lorelei Pont about neurology referral. Sending note to Dr Lorelei Pont and Butch Penny CMA. ( Do not see note from [facility].)

## 2022-02-21 NOTE — Telephone Encounter (Signed)
Patient wife called and stated patient is having a massive headache and is headed to the ER. Wife also stated that need a neurology referral and she called [facility] and could get him in there fax number [phone], phone [phone]. Also stated that he has been diagnosed with Melanoma on 02/07/2022. Surgery is on October 3 for Melanoma. Call back number [phone] that is his wife number.

## 2022-02-22 NOTE — Telephone Encounter (Signed)
Spoke with Radonna Ricker and advised that I just faxed the referral to [facility] at [phone] as requested.  She will call them back to schedule.

## 2022-02-22 NOTE — Telephone Encounter (Signed)
Patient wife called and stated she wanted to know which [place] was it sent to because the one she spoke with doesn't have the referral. Call back number [phone].

## 2022-02-22 NOTE — Telephone Encounter (Signed)
Left message for Mrs. Rout with below message for Dr. Lorelei Pont.  Will forward message to Asht[place] to get referral sent over to [facility].

## 2022-02-22 NOTE — Telephone Encounter (Signed)
Please call Troy Sims.  I did put in a Neurology referral for him, but they may allow him to make an appointment himself.  If they cannot see him in a timely matter, then he should come in to the office to be seen.

## 2022-02-23 NOTE — Telephone Encounter (Addendum)
Referral re-faxed to [phone].  Received confirmation that fax did go through.

## 2022-02-23 NOTE — Telephone Encounter (Signed)
Patient wife called and stated that the office didn't receive the fax and can it be resent.

## 2022-02-24 NOTE — Telephone Encounter (Signed)
I called and spoke with [facility].  Troy Sims has been scheduled and a different fax number,  to the office he is scheduled at,  was given so I re-faxed the referral to [phone].  Ally notified of this via telephone.  Nothing further is needed.

## 2022-02-24 NOTE — Telephone Encounter (Signed)
Patient wife called and stated can the doctor office be called because the faxed has not been received yet.

## 2022-05-11 ENCOUNTER — Telehealth: Payer: Self-pay | Admitting: Family Medicine

## 2022-05-11 NOTE — Telephone Encounter (Signed)
Spoke with [name].  He states he is looking for the date when he saw Dr. Patsy Lager and had him look at a spot on the back of his arm.   He states at the time Dr. Patsy Lager wasn't worried about it and it was many years ago, but now it has been diagnosed as Melanoma.  He was reporting this to the [place] and they were wanting a date when this area was first checked by MD.  I have reviewed all office notes and do no see any documentation where Dr. Patsy Lager looked at an area on the back of his arm.  Patient advised of this via telephone.  I will also forward note to Dr. Patsy Lager to double check behind me.

## 2022-05-11 NOTE — Telephone Encounter (Signed)
Left message for [name] to return my call.

## 2022-05-11 NOTE — Telephone Encounter (Signed)
Patient called and stated he wants some information on his medical history and wanted to know when he was seen with a spot on his back. Call back number [phone].

## 2022-05-11 NOTE — Telephone Encounter (Signed)
Patient called in returning a call he received.  

## 2022-05-12 NOTE — Telephone Encounter (Signed)
Reviewed with him on the phone.  I have it down that I looked at his skin in 2020 and 2016, and it looked normal at that time, but I do not specifically say there was an area of concern on his arm.
